# Patient Record
Sex: Female | Born: 1990 | Race: Asian | Hispanic: No | Marital: Married | Smoking: Never smoker
Health system: Southern US, Community
[De-identification: ages and names within clinical notes are randomized; demographics above are authoritative.]

## PROBLEM LIST (undated history)

## (undated) DIAGNOSIS — K602 Anal fissure, unspecified: Secondary | ICD-10-CM

## (undated) DIAGNOSIS — D649 Anemia, unspecified: Secondary | ICD-10-CM

## (undated) HISTORY — DX: Anemia, unspecified: D64.9

---

## 2019-02-21 HISTORY — PX: ANAL FISSURE REPAIR: SHX2312

## 2019-03-29 ENCOUNTER — Other Ambulatory Visit: Payer: Self-pay

## 2019-03-29 ENCOUNTER — Encounter (HOSPITAL_COMMUNITY): Payer: Self-pay

## 2019-03-29 ENCOUNTER — Emergency Department (HOSPITAL_COMMUNITY)
Admission: EM | Admit: 2019-03-29 | Discharge: 2019-03-30 | Disposition: A | Discharge status: Home / Self Care | Payer: No Typology Code available for payment source | Source: Home / Self Care | Attending: Emergency Medicine | Admitting: Emergency Medicine

## 2019-03-29 DIAGNOSIS — N12 Tubulo-interstitial nephritis, not specified as acute or chronic: Secondary | ICD-10-CM | POA: Insufficient documentation

## 2019-03-29 DIAGNOSIS — R300 Dysuria: Secondary | ICD-10-CM | POA: Diagnosis not present

## 2019-03-29 DIAGNOSIS — A4151 Sepsis due to Escherichia coli [E. coli]: Secondary | ICD-10-CM | POA: Diagnosis not present

## 2019-03-29 HISTORY — DX: Anal fissure, unspecified: K60.2

## 2019-03-29 LAB — URINALYSIS, ROUTINE W REFLEX MICROSCOPIC
Bilirubin Urine: NEGATIVE
Glucose, UA: NEGATIVE mg/dL
Ketones, ur: NEGATIVE mg/dL
Nitrite: NEGATIVE
Protein, ur: 30 mg/dL — AB
Specific Gravity, Urine: 1.005 (ref 1.005–1.030)
WBC, UA: >50 WBC/hpf — ABNORMAL HIGH (ref 0–5)
pH: 6 (ref 5.0–8.0)

## 2019-03-29 LAB — CBC
HCT: 29.2 % — ABNORMAL LOW (ref 36.0–46.0)
Hemoglobin: 8.4 g/dL — ABNORMAL LOW (ref 12.0–15.0)
MCH: 18.7 pg — ABNORMAL LOW (ref 26.0–34.0)
MCHC: 28.8 g/dL — ABNORMAL LOW (ref 30.0–36.0)
MCV: 65 fL — ABNORMAL LOW (ref 80.0–100.0)
Platelets: 312 10*3/uL (ref 150–400)
RBC: 4.49 MIL/uL (ref 3.87–5.11)
RDW: 19.6 % — ABNORMAL HIGH (ref 11.5–15.5)
WBC: 14.2 10*3/uL — ABNORMAL HIGH (ref 4.0–10.5)
nRBC: 0 % (ref 0.0–0.2)

## 2019-03-29 LAB — COMPREHENSIVE METABOLIC PANEL
ALT: 11 U/L (ref 0–44)
AST: 22 U/L (ref 15–41)
Albumin: 3.8 g/dL (ref 3.5–5.0)
Alkaline Phosphatase: 41 U/L (ref 38–126)
Anion gap: 11 (ref 5–15)
BUN: <5 mg/dL — ABNORMAL LOW (ref 6–20)
CO2: 22 mmol/L (ref 22–32)
Calcium: 9.1 mg/dL (ref 8.9–10.3)
Chloride: 97 mmol/L — ABNORMAL LOW (ref 98–111)
Creatinine, Ser: 0.8 mg/dL (ref 0.44–1.00)
GFR calc Af Amer: >60 mL/min (ref >60)
GFR calc non Af Amer: >60 mL/min (ref >60)
Glucose, Bld: 146 mg/dL — ABNORMAL HIGH (ref 70–99)
Potassium: 3.5 mmol/L (ref 3.5–5.1)
Sodium: 130 mmol/L — ABNORMAL LOW (ref 135–145)
Total Bilirubin: 0.3 mg/dL (ref 0.3–1.2)
Total Protein: 8.4 g/dL — ABNORMAL HIGH (ref 6.5–8.1)

## 2019-03-29 LAB — LIPASE, BLOOD: Lipase: 41 U/L (ref 11–51)

## 2019-03-29 MED ORDER — ONDANSETRON HCL 4 MG/2ML IJ SOLN
4.0000 mg | Freq: Once | INTRAMUSCULAR | Status: AC
  Administered 2019-03-30: 4 mg via INTRAVENOUS
  Filled 2019-03-29: qty 2

## 2019-03-29 MED ORDER — SODIUM CHLORIDE 0.9 % IV SOLN
1.0000 g | Freq: Once | INTRAVENOUS | Status: AC
  Administered 2019-03-30: 1 g via INTRAVENOUS
  Filled 2019-03-29: qty 10

## 2019-03-29 MED ORDER — SODIUM CHLORIDE 0.9 % IV BOLUS
2000.0000 mL | Freq: Once | INTRAVENOUS | Status: AC
  Administered 2019-03-30: 2000 mL via INTRAVENOUS

## 2019-03-29 MED ORDER — SODIUM CHLORIDE 0.9% FLUSH: 3.0000 mL | Freq: Once | INTRAVENOUS | Status: DC

## 2019-03-29 NOTE — ED Triage Notes (Signed)
Pt reports generalized body aches, fever and emesis after eating for the past few days, denies any abd pain, tachycardic in triage.

## 2019-03-30 ENCOUNTER — Telehealth: Payer: Self-pay | Admitting: Family

## 2019-03-30 ENCOUNTER — Ambulatory Visit: Payer: Self-pay | Admitting: Family Medicine

## 2019-03-30 DIAGNOSIS — E860 Dehydration: Secondary | ICD-10-CM | POA: Diagnosis present

## 2019-03-30 DIAGNOSIS — A4151 Sepsis due to Escherichia coli [E. coli]: Principal | ICD-10-CM | POA: Diagnosis present

## 2019-03-30 DIAGNOSIS — Z20822 Contact with and (suspected) exposure to covid-19: Secondary | ICD-10-CM

## 2019-03-30 DIAGNOSIS — N920 Excessive and frequent menstruation with regular cycle: Secondary | ICD-10-CM | POA: Diagnosis present

## 2019-03-30 DIAGNOSIS — Z0289 Encounter for other administrative examinations: Secondary | ICD-10-CM

## 2019-03-30 DIAGNOSIS — Z1612 Extended spectrum beta lactamase (ESBL) resistance: Secondary | ICD-10-CM | POA: Diagnosis present

## 2019-03-30 DIAGNOSIS — Z1624 Resistance to multiple antibiotics: Secondary | ICD-10-CM | POA: Diagnosis present

## 2019-03-30 DIAGNOSIS — N12 Tubulo-interstitial nephritis, not specified as acute or chronic: Secondary | ICD-10-CM | POA: Diagnosis present

## 2019-03-30 DIAGNOSIS — K5900 Constipation, unspecified: Secondary | ICD-10-CM | POA: Diagnosis present

## 2019-03-30 DIAGNOSIS — Z20828 Contact with and (suspected) exposure to other viral communicable diseases: Secondary | ICD-10-CM | POA: Diagnosis present

## 2019-03-30 DIAGNOSIS — E871 Hypo-osmolality and hyponatremia: Secondary | ICD-10-CM | POA: Diagnosis present

## 2019-03-30 DIAGNOSIS — E861 Hypovolemia: Secondary | ICD-10-CM | POA: Diagnosis present

## 2019-03-30 DIAGNOSIS — D509 Iron deficiency anemia, unspecified: Secondary | ICD-10-CM | POA: Diagnosis present

## 2019-03-30 LAB — PREGNANCY, URINE: Preg Test, Ur: NEGATIVE

## 2019-03-30 MED ORDER — CEPHALEXIN 500 MG PO CAPS: 500.0000 mg | ORAL_CAPSULE | Freq: Four times a day (QID) | ORAL | 0 refills | Status: DC

## 2019-03-30 MED ORDER — ONDANSETRON HCL 4 MG PO TABS: 4.0000 mg | ORAL_TABLET | Freq: Three times a day (TID) | ORAL | 0 refills | Status: DC | PRN

## 2019-03-30 MED ORDER — KETOROLAC TROMETHAMINE 30 MG/ML IJ SOLN
15.0000 mg | Freq: Once | INTRAMUSCULAR | Status: AC
  Administered 2019-03-30: 15 mg via INTRAVENOUS
  Filled 2019-03-30: qty 1

## 2019-03-30 MED ORDER — IBUPROFEN 800 MG PO TABS
800.0000 mg | ORAL_TABLET | Freq: Once | ORAL | Status: AC
  Administered 2019-03-30: 800 mg via ORAL
  Filled 2019-03-30: qty 1

## 2019-03-30 NOTE — ED Provider Notes (Signed)
[facility] Provider Note   CSN: [id number] Arrival date & time: 03/29/19  2229    History   Chief Complaint Chief Complaint  Patient presents with  . Generalized Body Aches  . Fever  . Emesis    HPI Diana Hensley is a 28 y.o. female.     28 year old female presents to the emergency department for evaluation of chills.  She has been experiencing chills over the past few days associated with body aches and fever up to approximately 101 F.  Anything that she has tried to eat or drink has often caused her to throw up.  Intermittently tolerating water as well as oranges.  Has noted some discomfort through her mid to low back.  Symptoms were preceded by urinary frequency and urgency with decreased urinary stream.  No frank dysuria.  Denies sick contacts, bowel changes, abdominal pain.  No history of prior abdominal surgeries.  LMP 03/12/2019.   Fever Associated symptoms: vomiting   Emesis Associated symptoms: fever     Past Medical History:  Diagnosis Date  . Anal fissure     There are no active problems to display for this patient.   History reviewed. No pertinent surgical history.   OB History   No obstetric history on file.      Home Medications    Prior to Admission medications   Medication Sig Start Date End Date Taking? Authorizing Provider  cephALEXin (KEFLEX) 500 MG capsule Take 1 capsule (500 mg total) by mouth 4 (four) times daily. 03/30/19   Antony MaduraHumes, Kenwood Rosiak, PA-C  ondansetron (ZOFRAN) 4 MG tablet Take 1 tablet (4 mg total) by mouth every 8 (eight) hours as needed for nausea or vomiting. 03/30/19   Antony MaduraHumes, Amerie Beaumont, PA-C    Family History No family history on file.  Social History Social History   Tobacco Use  . Smoking status: Not on file  Substance Use Topics  . Alcohol use: Not on file  . Drug use: Not on file     Allergies   Patient has no known allergies.   Review of Systems Review of Systems   Constitutional: Positive for fever.  Gastrointestinal: Positive for vomiting.  Ten systems reviewed and are negative for acute change, except as noted in the HPI.     Physical Exam Updated Vital Signs BP 107/67   Pulse 92   Temp 98.9 F (37.2 C) (Oral)   Resp 16   SpO2 99%   Physical Exam Vitals signs and nursing note reviewed.  Constitutional:      General: She is not in acute distress.    Appearance: She is well-developed.     Comments: Nontoxic appearing and in NAD  HENT:     Head: Normocephalic and atraumatic.  Eyes:     General: No scleral icterus.    Conjunctiva/sclera: Conjunctivae normal.  Neck:     Musculoskeletal: Normal range of motion.  Cardiovascular:     Rate and Rhythm: Regular rhythm. Tachycardia present.     Pulses: Normal pulses.  Pulmonary:     Effort: Pulmonary effort is normal. No respiratory distress.     Comments: Respirations even and unlabored Abdominal:     Palpations: There is no mass.     Tenderness: There is no guarding.     Comments: Bilateral CVA TTP, R>L. Soft nontender anterior abdomen. No distension or peritoneal signs.  Musculoskeletal: Normal range of motion.  Skin:    General: Skin is warm and dry.  Coloration: Skin is not pale.     Findings: No erythema or rash.  Neurological:     Mental Status: She is alert and oriented to person, place, and time.     Coordination: Coordination normal.  Psychiatric:        Behavior: Behavior normal.      ED Treatments / Results  Labs (all labs ordered are listed, but only abnormal results are displayed) Labs Reviewed  COMPREHENSIVE METABOLIC PANEL - Abnormal; Notable for the following components:      Result Value   Sodium 130 (*)    Chloride 97 (*)    Glucose, Bld 146 (*)    BUN <5 (*)    Total Protein 8.4 (*)    All other components within normal limits  CBC - Abnormal; Notable for the following components:   WBC 14.2 (*)    Hemoglobin 8.4 (*)    HCT 29.2 (*)    MCV 65.0  (*)    MCH 18.7 (*)    MCHC 28.8 (*)    RDW 19.6 (*)    All other components within normal limits  URINALYSIS, ROUTINE W REFLEX MICROSCOPIC - Abnormal; Notable for the following components:   APPearance CLOUDY (*)    Hgb urine dipstick SMALL (*)    Protein, ur 30 (*)    Leukocytes,Ua LARGE (*)    WBC, UA >50 (*)    Bacteria, UA MANY (*)    All other components within normal limits  URINE CULTURE  LIPASE, BLOOD  PREGNANCY, URINE    EKG None  Radiology No results found.  Procedures Procedures (including critical care time)  Medications Ordered in ED Medications  sodium chloride flush (NS) 0.9 % injection 3 mL (has no administration in time range)  sodium chloride 0.9 % bolus 2,000 mL (0 mLs Intravenous Stopped 03/30/19 0150)  cefTRIAXone (ROCEPHIN) 1 g in sodium chloride 0.9 % 100 mL IVPB (0 g Intravenous Stopped 03/30/19 0122)  ondansetron (ZOFRAN) injection 4 mg (4 mg Intravenous Given 03/30/19 0039)  ibuprofen (ADVIL) tablet 800 mg (800 mg Oral Given 03/30/19 0049)  ketorolac (TORADOL) 30 MG/ML injection 15 mg (15 mg Intravenous Given 03/30/19 0152)     Initial Impression / Assessment and Plan / ED Course  I have reviewed the triage vital signs and the nursing notes.  Pertinent labs & imaging results that were available during my care of the patient were reviewed by me and considered in my medical decision making (see chart for details).        Patient has been diagnosed with pyelonephritis. She is afebrile in the ED, but tachycardic in triage. This has improved with Tylenol and 2L IVF. Hx of nausea and vomiting over the past few days, but tolerating PO since receiving Zofran. She has evidence of UTI on UA. Leukocytosis and CVA TTP c/w clinical pyelonephritis. Kidney function preserved. Of note, the patient is anemic with hemoglobin of 8.4. This is microcytic suggesting chronic process. No old values for comparison.   Plan for discharge on Keflex 500mg  QID x 10 days.  Patient encouraged to f/u with a PCP for repeat evaluation to ensure symptom resolution. Return precautions discussed and provided. Patient discharged in stable condition with no unaddressed concerns.  Vitals:   03/30/19 0130 03/30/19 0215 03/30/19 0230 03/30/19 0300  BP: 112/74 106/72  107/67  Pulse: (!) 102 97 92   Resp:      Temp:      TempSrc:      SpO2:  100% 99% 99%     Final Clinical Impressions(s) / ED Diagnoses   Final diagnoses:  Pyelonephritis    ED Discharge Orders         Ordered    cephALEXin (KEFLEX) 500 MG capsule  4 times daily     03/30/19 0305    ondansetron (ZOFRAN) 4 MG tablet  Every 8 hours PRN     03/30/19 0306           Antony MaduraHumes, Liat Mayol, PA-C 03/30/19 0320    Nira Connardama, Pedro Eduardo, MD 03/30/19 [phone]

## 2019-03-30 NOTE — Progress Notes (Signed)
E-Visit for Corona Virus Screening   Your current symptoms could be consistent with the coronavirus.  Many health care providers can now test patients at their office but not all are.  [facility] has multiple testing sites. For information on our COVID testing locations and hours go to [email or web address]  Please quarantine yourself while awaiting your test results.  Approximately 5 minutes was spent documenting and reviewing patient's chart.    COVID-19 is a respiratory illness with symptoms that are similar to the flu. Symptoms are typically mild to moderate, but there have been cases of severe illness and death due to the virus. The following symptoms may appear 2-14 days after exposure: . Fever . Cough . Shortness of breath or difficulty breathing . Chills . Repeated shaking with chills . Muscle pain . Headache . Sore throat . New loss of taste or smell . Fatigue . Congestion or runny nose . Nausea or vomiting . Diarrhea  It is vitally important that if you feel that you have an infection such as this virus or any other virus that you stay home and away from places where you may spread it to others.  You should self-quarantine for 14 days if you have symptoms that could potentially be coronavirus or have been in close contact a with a person diagnosed with COVID-19 within the last 2 weeks. You should avoid contact with people age 65 and older.   You should wear a mask or cloth face covering over your nose and mouth if you must be around other people or animals, including pets (even at home). Try to stay at least 6 feet away from other people. This will protect the people around you.    You may also take acetaminophen (Tylenol) as needed for fever.   Reduce your risk of any infection by using the same precautions used for avoiding the common cold or flu:  . Wash your hands often with soap and warm water for at least 20 seconds.  If soap and water are  not readily available, use an alcohol-based hand sanitizer with at least 60% alcohol.  . If coughing or sneezing, cover your mouth and nose by coughing or sneezing into the elbow areas of your shirt or coat, into a tissue or into your sleeve (not your hands). . Avoid shaking hands with others and consider head nods or verbal greetings only. . Avoid touching your eyes, nose, or mouth with unwashed hands.  . Avoid close contact with people who are sick. . Avoid places or events with large numbers of people in one location, like concerts or sporting events. . Carefully consider travel plans you have or are making. . If you are planning any travel outside or inside the [place], visit the CDC's Travelers' Health webpage for the latest health notices. . If you have some symptoms but not all symptoms, continue to monitor at home and seek medical attention if your symptoms worsen. . If you are having a medical emergency, call 911.  HOME CARE . Only take medications as instructed by your medical team. . Drink plenty of fluids and get plenty of rest. . A steam or ultrasonic humidifier can help if you have congestion.   GET HELP RIGHT AWAY IF YOU HAVE EMERGENCY WARNING SIGNS** FOR COVID-19. If you or someone is showing any of these signs seek emergency medical care immediately. Call 911 or proceed to your closest emergency facility if: . You develop worsening high fever. . Trouble breathing . Bluish lips   or face . Persistent pain or pressure in the chest . New confusion . Inability to wake or stay awake . You cough up blood. . Your symptoms become more severe  **This list is not all possible symptoms. Contact your medical provider for any symptoms that are sever or concerning to you.   MAKE SURE YOU   Understand these instructions.  Will watch your condition.  Will get help right away if you are not doing well or get worse.  Your e-visit answers were reviewed by a board certified advanced  clinical practitioner to complete your personal care plan.  Depending on the condition, your plan could have included both over the counter or prescription medications.  If there is a problem please reply once you have received a response from your provider.  Your safety is important to us.  If you have drug allergies check your prescription carefully.    You can use MyChart to ask questions about today's visit, request a non-urgent call back, or ask for a work or school excuse for 24 hours related to this e-Visit. If it has been greater than 24 hours you will need to follow up with your provider, or enter a new e-Visit to address those concerns. You will get an e-mail in the next two days asking about your experience.  I hope that your e-visit has been valuable and will speed your recovery. Thank you for using e-visits.    

## 2019-03-30 NOTE — ED Notes (Signed)
Pt tolerating fluids well, no n/v reported

## 2019-03-30 NOTE — ED Notes (Signed)
Discharge instructions discussed with pt. Pt verbalized understanding. Pt stable and ambulatory. No signature pad.  °

## 2019-03-30 NOTE — ED Notes (Signed)
Patient husband would like an update via phone

## 2019-03-30 NOTE — Discharge Instructions (Signed)
Take Keflex as prescribed until finished for management of your bladder/kidney infection.  Do not miss any doses of this antibiotic.  Take 600 mg ibuprofen every 6 hours for body aches, fever, pain.  You may take this with Tylenol as needed for persistent symptoms.  Use Zofran as prescribed for nausea management.  Follow-up with a primary care doctor to ensure that symptoms resolve.

## 2019-03-31 ENCOUNTER — Inpatient Hospital Stay (HOSPITAL_COMMUNITY)
Admission: EM | Admit: 2019-03-31 | Discharge: 2019-04-02 | DRG: 872 | Disposition: A | Discharge status: Home / Self Care | Payer: No Typology Code available for payment source | Attending: Internal Medicine | Admitting: Internal Medicine

## 2019-03-31 ENCOUNTER — Encounter (HOSPITAL_COMMUNITY): Payer: Self-pay | Admitting: Emergency Medicine

## 2019-03-31 ENCOUNTER — Other Ambulatory Visit: Payer: Self-pay

## 2019-03-31 DIAGNOSIS — N390 Urinary tract infection, site not specified: Secondary | ICD-10-CM | POA: Diagnosis present

## 2019-03-31 DIAGNOSIS — A419 Sepsis, unspecified organism: Secondary | ICD-10-CM | POA: Diagnosis not present

## 2019-03-31 DIAGNOSIS — D509 Iron deficiency anemia, unspecified: Secondary | ICD-10-CM | POA: Diagnosis not present

## 2019-03-31 DIAGNOSIS — A4150 Gram-negative sepsis, unspecified: Secondary | ICD-10-CM | POA: Diagnosis present

## 2019-03-31 DIAGNOSIS — E871 Hypo-osmolality and hyponatremia: Secondary | ICD-10-CM | POA: Diagnosis not present

## 2019-03-31 DIAGNOSIS — N12 Tubulo-interstitial nephritis, not specified as acute or chronic: Secondary | ICD-10-CM

## 2019-03-31 LAB — URINALYSIS, ROUTINE W REFLEX MICROSCOPIC
Bilirubin Urine: NEGATIVE
Glucose, UA: NEGATIVE mg/dL
Hgb urine dipstick: NEGATIVE
Ketones, ur: 20 mg/dL — AB
Nitrite: NEGATIVE
Protein, ur: NEGATIVE mg/dL
Specific Gravity, Urine: 1.01 (ref 1.005–1.030)
pH: 7 (ref 5.0–8.0)

## 2019-03-31 LAB — CBC WITH DIFFERENTIAL/PLATELET
Abs Immature Granulocytes: 0.06 10*3/uL (ref 0.00–0.07)
Basophils Absolute: 0 10*3/uL (ref 0.0–0.1)
Basophils Relative: 0 %
Eosinophils Absolute: 0 10*3/uL (ref 0.0–0.5)
Eosinophils Relative: 0 %
HCT: 26.2 % — ABNORMAL LOW (ref 36.0–46.0)
Hemoglobin: 7.7 g/dL — ABNORMAL LOW (ref 12.0–15.0)
Immature Granulocytes: 1 %
Lymphocytes Relative: 11 %
Lymphs Abs: 1.4 10*3/uL (ref 0.7–4.0)
MCH: 19.4 pg — ABNORMAL LOW (ref 26.0–34.0)
MCHC: 29.4 g/dL — ABNORMAL LOW (ref 30.0–36.0)
MCV: 66 fL — ABNORMAL LOW (ref 80.0–100.0)
Monocytes Absolute: 2 10*3/uL — ABNORMAL HIGH (ref 0.1–1.0)
Monocytes Relative: 16 %
Neutro Abs: 9.2 10*3/uL — ABNORMAL HIGH (ref 1.7–7.7)
Neutrophils Relative %: 72 %
Platelets: 254 10*3/uL (ref 150–400)
RBC: 3.97 MIL/uL (ref 3.87–5.11)
RDW: 19.5 % — ABNORMAL HIGH (ref 11.5–15.5)
WBC: 12.6 10*3/uL — ABNORMAL HIGH (ref 4.0–10.5)
nRBC: 0 % (ref 0.0–0.2)

## 2019-03-31 LAB — COMPREHENSIVE METABOLIC PANEL
ALT: 11 U/L (ref 0–44)
AST: 17 U/L (ref 15–41)
Albumin: 3.3 g/dL — ABNORMAL LOW (ref 3.5–5.0)
Alkaline Phosphatase: 42 U/L (ref 38–126)
Anion gap: 9 (ref 5–15)
BUN: <5 mg/dL — ABNORMAL LOW (ref 6–20)
CO2: 21 mmol/L — ABNORMAL LOW (ref 22–32)
Calcium: 8.5 mg/dL — ABNORMAL LOW (ref 8.9–10.3)
Chloride: 101 mmol/L (ref 98–111)
Creatinine, Ser: 0.69 mg/dL (ref 0.44–1.00)
GFR calc Af Amer: >60 mL/min (ref >60)
GFR calc non Af Amer: >60 mL/min (ref >60)
Glucose, Bld: 114 mg/dL — ABNORMAL HIGH (ref 70–99)
Potassium: 4.2 mmol/L (ref 3.5–5.1)
Sodium: 131 mmol/L — ABNORMAL LOW (ref 135–145)
Total Bilirubin: 0.3 mg/dL (ref 0.3–1.2)
Total Protein: 7.1 g/dL (ref 6.5–8.1)

## 2019-03-31 LAB — URINE CULTURE: Culture: >=100000 — AB

## 2019-03-31 LAB — RETICULOCYTES
Immature Retic Fract: 15.2 % (ref 2.3–15.9)
RBC.: 3.69 MIL/uL — ABNORMAL LOW (ref 3.87–5.11)
Retic Count, Absolute: 30.6 10*3/uL (ref 19.0–186.0)
Retic Ct Pct: 0.8 % (ref 0.4–3.1)

## 2019-03-31 LAB — TYPE AND SCREEN
ABO/RH(D): O POS
Antibody Screen: NEGATIVE

## 2019-03-31 LAB — IRON AND TIBC
Iron: 6 ug/dL — ABNORMAL LOW (ref 28–170)
Saturation Ratios: 2 % — ABNORMAL LOW (ref 10.4–31.8)
TIBC: 300 ug/dL (ref 250–450)
UIBC: 294 ug/dL

## 2019-03-31 LAB — HIV ANTIBODY (ROUTINE TESTING W REFLEX): HIV Screen 4th Generation wRfx: NONREACTIVE

## 2019-03-31 LAB — LACTIC ACID, PLASMA: Lactic Acid, Venous: 1 mmol/L (ref 0.5–1.9)

## 2019-03-31 LAB — FOLATE: Folate: 22.4 ng/mL (ref >5.9)

## 2019-03-31 LAB — VITAMIN B12: Vitamin B-12: 157 pg/mL — ABNORMAL LOW (ref 180–914)

## 2019-03-31 LAB — ABO/RH: ABO/RH(D): O POS

## 2019-03-31 LAB — I-STAT BETA HCG BLOOD, ED (MC, WL, AP ONLY): I-stat hCG, quantitative: <5 m[IU]/mL (ref <5)

## 2019-03-31 LAB — FERRITIN: Ferritin: 17 ng/mL (ref 11–307)

## 2019-03-31 MED ORDER — SODIUM CHLORIDE 0.9 % IV SOLN
1.0000 g | Freq: Once | INTRAVENOUS | Status: AC
  Administered 2019-03-31: 03:00:00 1 g via INTRAVENOUS
  Filled 2019-03-31: qty 10

## 2019-03-31 MED ORDER — SODIUM CHLORIDE 0.9% FLUSH: 3.0000 mL | Freq: Two times a day (BID) | INTRAVENOUS | Status: DC

## 2019-03-31 MED ORDER — ONDANSETRON HCL 4 MG PO TABS: 4.0000 mg | ORAL_TABLET | Freq: Four times a day (QID) | ORAL | Status: DC | PRN

## 2019-03-31 MED ORDER — SODIUM CHLORIDE 0.9 % IV SOLN
1.0000 g | INTRAVENOUS | Status: DC
  Filled 2019-03-31: qty 10

## 2019-03-31 MED ORDER — SODIUM CHLORIDE 0.9 % IV SOLN
1.0000 g | Freq: Three times a day (TID) | INTRAVENOUS | Status: AC
  Administered 2019-03-31 – 2019-04-01 (×5): 1 g via INTRAVENOUS
  Filled 2019-03-31 (×6): qty 1

## 2019-03-31 MED ORDER — ONDANSETRON HCL 4 MG/2ML IJ SOLN
4.0000 mg | Freq: Once | INTRAMUSCULAR | Status: AC
  Administered 2019-03-31: 4 mg via INTRAVENOUS
  Filled 2019-03-31: qty 2

## 2019-03-31 MED ORDER — POLYETHYLENE GLYCOL 3350 17 G PO PACK: 17.0000 g | PACK | Freq: Every day | ORAL | Status: DC | PRN

## 2019-03-31 MED ORDER — ACETAMINOPHEN 650 MG RE SUPP: 650.0000 mg | Freq: Four times a day (QID) | RECTAL | Status: DC | PRN

## 2019-03-31 MED ORDER — SODIUM CHLORIDE 0.9 % IV BOLUS
1500.0000 mL | Freq: Once | INTRAVENOUS | Status: AC
  Administered 2019-03-31: 03:00:00 1500 mL via INTRAVENOUS

## 2019-03-31 MED ORDER — ONDANSETRON HCL 4 MG/2ML IJ SOLN: 4.0000 mg | Freq: Four times a day (QID) | INTRAMUSCULAR | Status: DC | PRN

## 2019-03-31 MED ORDER — SODIUM CHLORIDE 0.9 % IV SOLN
INTRAVENOUS | Status: DC
  Administered 2019-03-31 – 2019-04-01 (×3): via INTRAVENOUS

## 2019-03-31 MED ORDER — HYDROCODONE-ACETAMINOPHEN 5-325 MG PO TABS
1.0000 | ORAL_TABLET | ORAL | Status: DC | PRN
  Administered 2019-03-31 – 2019-04-01 (×3): 1 via ORAL
  Filled 2019-03-31 (×3): qty 1

## 2019-03-31 MED ORDER — ACETAMINOPHEN 325 MG PO TABS
650.0000 mg | ORAL_TABLET | Freq: Four times a day (QID) | ORAL | Status: DC | PRN
  Administered 2019-03-31: 650 mg via ORAL
  Filled 2019-03-31: qty 2

## 2019-03-31 MED ORDER — ACETAMINOPHEN 325 MG PO TABS
650.0000 mg | ORAL_TABLET | Freq: Once | ORAL | Status: AC
  Administered 2019-03-31: 650 mg via ORAL
  Filled 2019-03-31: qty 2

## 2019-03-31 NOTE — Progress Notes (Signed)
Patient's temperature 103.2. Tylenol give at 1350. Rechecked temp at 1422 temp was 102.7. Ghimire, MD text paged and made aware. Will continue to monitor this patient.

## 2019-03-31 NOTE — Progress Notes (Signed)
Patient received from ER to room 6N14 in stable condition. Pt alert and oriented x4. Oriented her to room,call bell use,. Discussed plan of care and instructed her to call for assistance if she needs to get up. She agreed she would and demonstrated call bell kuse.

## 2019-03-31 NOTE — ED Triage Notes (Signed)
Patient reports persistent emesis for 3 days , no diarrhea , febrile at triage . Denies sick contact with family .

## 2019-03-31 NOTE — ED Provider Notes (Signed)
[facility] EMERGENCY DEPARTMENT Provider Note   CSN: [id number] Arrival date & time: 03/30/19  2337    History   Chief Complaint Chief Complaint  Patient presents with  . Emesis    HPI Diana Hensley is a 28 y.o. female.    28 year old female presents to the emergency department for evaluation of persistent nausea and vomiting.  Was seen yesterday by myself and diagnosed with pyelonephritis.  Discharged on a course of Keflex.  Reports taking 3 doses of this antibiotic, though she has had difficulty keeping down medications as well as food and water due to persistent vomiting.  Her vomiting has been unrelieved with Zofran.  No complaints of abdominal pain or back pain.  Had a fever up to 101 F today.  Last took Tylenol this afternoon.  Noted to be anemic yesterday.  Denies increased bleeding or bruising, melena, hematochezia, hematuria, vaginal bleeding.  Was told that her hemoglobin was ~8.3 when she was living in [place], per patient.  The history is provided by the patient. No language interpreter was used.  Emesis   Past Medical History:  Diagnosis Date  . Anal fissure     Patient Active Problem List   Diagnosis Date Noted  . Sepsis secondary to UTI ([place]) 03/31/2019  . Microcytic anemia 03/31/2019  . Hyponatremia 03/31/2019    History reviewed. No pertinent surgical history.   OB History   No obstetric history on file.      Home Medications    Prior to Admission medications   Medication Sig Start Date End Date Taking? Authorizing Provider  cephALEXin (KEFLEX) 500 MG capsule Take 1 capsule (500 mg total) by mouth 4 (four) times daily. 03/30/19  Yes Antonietta Breach, PA-C  ondansetron (ZOFRAN) 4 MG tablet Take 1 tablet (4 mg total) by mouth every 8 (eight) hours as needed for nausea or vomiting. 03/30/19  Yes Antonietta Breach, PA-C    Family History History reviewed. No pertinent family history.  Social History Social History   Tobacco Use  .  Smoking status: Never Smoker  . Smokeless tobacco: Never Used  Substance Use Topics  . Alcohol use: Never    Frequency: Never  . Drug use: Never     Allergies   Patient has no known allergies.   Review of Systems Review of Systems  Gastrointestinal: Positive for vomiting.  Ten systems reviewed and are negative for acute change, except as noted in the HPI.    Physical Exam Updated Vital Signs BP 111/82 (BP Location: Left Arm)   Pulse 98   Temp 98.5 F (36.9 C) (Oral)   Resp 18   Ht 5\' 4"  (1.626 m)   Wt 53 kg   LMP 03/12/2019   SpO2 100%   BMI 20.06 kg/m   Physical Exam Vitals signs and nursing note reviewed.  Constitutional:      General: She is not in acute distress.    Appearance: She is well-developed. She is not diaphoretic.     Comments: Patient warm to touch. In NAD.  HENT:     Head: Normocephalic and atraumatic.  Eyes:     General: No scleral icterus.    Conjunctiva/sclera: Conjunctivae normal.  Neck:     Musculoskeletal: Normal range of motion.  Cardiovascular:     Rate and Rhythm: Normal rate and regular rhythm.     Pulses: Normal pulses.     Comments: Not tachycardic as noted in triage. Pulmonary:     Effort: Pulmonary effort is  normal. No respiratory distress.     Breath sounds: No stridor. No wheezing, rhonchi or rales.     Comments: Lungs CTAB. Respirations even and unlabored. Abdominal:     Palpations: There is no mass.     Tenderness: There is no abdominal tenderness. There is no guarding.     Comments: Soft, nondistended, nontender abdomen.  Musculoskeletal: Normal range of motion.  Skin:    General: Skin is warm and dry.     Coloration: Skin is not pale.     Findings: No erythema or rash.  Neurological:     Mental Status: She is alert and oriented to person, place, and time.  Psychiatric:        Behavior: Behavior normal.      ED Treatments / Results  Labs (all labs ordered are listed, but only abnormal results are displayed)  Labs Reviewed  CBC WITH DIFFERENTIAL/PLATELET - Abnormal; Notable for the following components:      Result Value   WBC 12.6 (*)    Hemoglobin 7.7 (*)    HCT 26.2 (*)    MCV 66.0 (*)    MCH 19.4 (*)    MCHC 29.4 (*)    RDW 19.5 (*)    Neutro Abs 9.2 (*)    Monocytes Absolute 2.0 (*)    All other components within normal limits  COMPREHENSIVE METABOLIC PANEL - Abnormal; Notable for the following components:   Sodium 131 (*)    CO2 21 (*)    Glucose, Bld 114 (*)    BUN <5 (*)    Calcium 8.5 (*)    Albumin 3.3 (*)    All other components within normal limits  URINALYSIS, ROUTINE W REFLEX MICROSCOPIC - Abnormal; Notable for the following components:   APPearance HAZY (*)    Ketones, ur 20 (*)    Leukocytes,Ua MODERATE (*)    Bacteria, UA MANY (*)    All other components within normal limits  NOVEL CORONAVIRUS, NAA (HOSPITAL ORDER, SEND-OUT TO REF LAB)  CULTURE, BLOOD (ROUTINE X 2)  CULTURE, BLOOD (ROUTINE X 2)  LACTIC ACID, PLASMA  I-STAT BETA HCG BLOOD, ED ([facility], [facility], [facility] ONLY)  TYPE AND SCREEN  ABO/RH    EKG None  Radiology No results found.  Procedures Procedures (including critical care time)  Medications Ordered in ED Medications  acetaminophen (TYLENOL) tablet 650 mg (650 mg Oral Given 03/31/19 0014)  sodium chloride 0.9 % bolus 1,500 mL (1,500 mLs Intravenous New Bag/Given 03/31/19 0232)  ondansetron (ZOFRAN) injection 4 mg (4 mg Intravenous Given 03/31/19 0228)  cefTRIAXone (ROCEPHIN) 1 g in sodium chloride 0.9 % 100 mL IVPB (0 g Intravenous Stopped 03/31/19 0303)     Initial Impression / Assessment and Plan / ED Course  I have reviewed the triage vital signs and the nursing notes.  Pertinent labs & imaging results that were available during my care of the patient were reviewed by me and considered in my medical decision making (see chart for details).        28 year old female presents to the emergency department for persistent fever, generalized weakness,  intractable nausea and vomiting.  Was discharged yesterday from the ED with Zofran, but has continued to be unable to tolerate fluids by mouth.  Febrile and tachycardic in triage.  This has improved with antipyretics.  Diagnosed with pyelonephritis yesterday.  Urine today appears improved.  Plan for admission for continued observation, IV antibiotics.  Dr. Antionette Charpyd to admit.   Final Clinical Impressions(s) /  ED Diagnoses   Final diagnoses:  Pyelonephritis    ED Discharge Orders    None       Antony MaduraHumes, Darriel Utter, PA-C 03/31/19 [id number]    Zadie RhineWickline, Donald, MD 03/31/19 0600

## 2019-03-31 NOTE — H&P (Signed)
History and Physical    Diana Hensley ZOX:[id number] DOB: [birth year] DOA: 03/31/2019  PCP: Patient, No Pcp Per   Patient coming from: Home   Chief Complaint: Dysuria, N/V, fevers  HPI: Diana GumsMariya Hensley is a 28 y.o. female with medical history significant for anemia, now presenting to the emergency department for evaluation of fevers, dysuria, nausea, and vomiting.  Patient was seen in the emergency department the night of 03/29/2019 for the same symptoms that developed a few days earlier.  She was diagnosed with urinary tract infection, given a shot of Rocephin, 2 L of IV fluids, and was discharged home with Keflex.  She continues to have fevers, chills, nausea, and nonbloody vomiting, feels as though she is not improving at all.  She denies any flank pain or abdominal pain, but she reports some general aches and malaise.  She denies any cough or shortness of breath, and she does not know of any sick contacts.  She reports being told that she was anemic previously, believes that her hemoglobin was 8 at that time, denies melena or hematochezia, denies easy bruising or bleeding, and reports heavy menstrual bleeding every month.  ED Course: Upon arrival to the ED, patient is found to be febrile to 39.4 C, saturating well on room air, tachycardic in 110s, and with stable blood pressure.  Chemistry panel is notable for a sodium of 131 and CBC features a leukocytosis to 12,600 and microcytic anemia with hemoglobin 7.7.  Blood cultures were collected, 1.5 L normal saline was administered, and the patient was treated with acetaminophen and Rocephin.  She has defervesced and heart rate has normalized, but she continues to have nausea, unsure if she will be able to take oral antibiotic, and hospitalist consulted for admission.  Review of Systems:  All other systems reviewed and apart from HPI, are negative.  Past Medical History:  Diagnosis Date  . Anal fissure     History reviewed. No pertinent surgical  history.   reports that she has never smoked. She has never used smokeless tobacco. She reports that she does not drink alcohol or use drugs.  No Known Allergies  History reviewed. No pertinent family history.   Prior to Admission medications   Medication Sig Start Date End Date Taking? Authorizing Provider  cephALEXin (KEFLEX) 500 MG capsule Take 1 capsule (500 mg total) by mouth 4 (four) times daily. 03/30/19  Yes Antony MaduraHumes, Kelly, PA-C  ondansetron (ZOFRAN) 4 MG tablet Take 1 tablet (4 mg total) by mouth every 8 (eight) hours as needed for nausea or vomiting. 03/30/19  Yes Antony MaduraHumes, Kelly, PA-C    Physical Exam: Vitals:   03/30/19 2345 03/31/19 0003 03/31/19 0230 03/31/19 0253  BP: 115/72 105/71 117/77 111/82  Pulse: (!) 115 (!) 113 85 98  Resp: 17 16  18   Temp: (!) 102.9 F (39.4 C) (!) 102.4 F (39.1 C)  98.5 F (36.9 C)  TempSrc: Oral Oral  Oral  SpO2: 100% 100% 100% 100%  Weight: 53 kg     Height: 5\' 4"  (1.626 m)       Constitutional: NAD, calm  Eyes: PERTLA, lids and conjunctivae normal ENMT: Mucous membranes are moist. Posterior pharynx clear of any exudate or lesions.   Neck: normal, supple, no masses, no thyromegaly Respiratory:  no wheezing, no crackles. Normal respiratory effort. No accessory muscle use.  Cardiovascular: S1 & S2 heard, regular rate and rhythm. No extremity edema.  Abdomen: No distension, no tenderness, soft. Bowel sounds active.  Musculoskeletal: no clubbing /  cyanosis. No joint deformity upper and lower extremities.    Skin: no significant rashes, lesions, ulcers. Warm, dry, well-perfused. Neurologic: CN 2-12 grossly intact. Sensation intact. Strength 5/5 in all 4 limbs.  Psychiatric: Alert and oriented x 3. Pleasant, cooperative.    Labs on Admission: I have personally reviewed following labs and imaging studies  CBC: Recent Labs  Lab 03/29/19 2249 03/31/19 0015  WBC 14.2* 12.6*  NEUTROABS  --  9.2*  HGB 8.4* 7.7*  HCT 29.2* 26.2*  MCV  65.0* 66.0*  PLT 312 277   Basic Metabolic Panel: Recent Labs  Lab 03/29/19 2249 03/31/19 0015  NA 130* 131*  K 3.5 4.2  CL 97* 101  CO2 22 21*  GLUCOSE 146* 114*  BUN <5* <5*  CREATININE 0.80 0.69  CALCIUM 9.1 8.5*   GFR: Estimated Creatinine Clearance: 89.2 mL/min (by C-G formula based on SCr of 0.69 mg/dL). Liver Function Tests: Recent Labs  Lab 03/29/19 2249 03/31/19 0015  AST 22 17  ALT 11 11  ALKPHOS 41 42  BILITOT 0.3 0.3  PROT 8.4* 7.1  ALBUMIN 3.8 3.3*   Recent Labs  Lab 03/29/19 2249  LIPASE 41   No results for input(s): AMMONIA in the last 168 hours. Coagulation Profile: No results for input(s): INR, PROTIME in the last 168 hours. Cardiac Enzymes: No results for input(s): CKTOTAL, CKMB, CKMBINDEX, TROPONINI in the last 168 hours. BNP (last 3 results) No results for input(s): PROBNP in the last 8760 hours. HbA1C: No results for input(s): HGBA1C in the last 72 hours. CBG: No results for input(s): GLUCAP in the last 168 hours. Lipid Profile: No results for input(s): CHOL, HDL, LDLCALC, TRIG, CHOLHDL, LDLDIRECT in the last 72 hours. Thyroid Function Tests: No results for input(s): TSH, T4TOTAL, FREET4, T3FREE, THYROIDAB in the last 72 hours. Anemia Panel: No results for input(s): VITAMINB12, FOLATE, FERRITIN, TIBC, IRON, RETICCTPCT in the last 72 hours. Urine analysis:    Component Value Date/Time   COLORURINE YELLOW 03/31/2019 0013   APPEARANCEUR HAZY (A) 03/31/2019 0013   LABSPEC 1.010 03/31/2019 0013   PHURINE 7.0 03/31/2019 0013   GLUCOSEU NEGATIVE 03/31/2019 0013   HGBUR NEGATIVE 03/31/2019 0013   BILIRUBINUR NEGATIVE 03/31/2019 0013   KETONESUR 20 (A) 03/31/2019 0013   PROTEINUR NEGATIVE 03/31/2019 0013   NITRITE NEGATIVE 03/31/2019 0013   LEUKOCYTESUR MODERATE (A) 03/31/2019 0013   Sepsis Labs: @LABRCNTIP (procalcitonin:4,lacticidven:4) ) Recent Results (from the past 240 hour(s))  Urine culture     Status: Abnormal (Preliminary  result)   Collection Time: 03/30/19 12:30 AM   Specimen: Urine, Clean Catch  Result Value Ref Range Status   Specimen Description URINE, CLEAN CATCH  Final   Special Requests NONE  Final   Culture (A)  Final    >=100,000 COLONIES/mL GRAM NEGATIVE RODS IDENTIFICATION AND SUSCEPTIBILITIES TO FOLLOW Performed at [facility], [address] [place], [place] [address]    Report Status PENDING  Incomplete     Radiological Exams on Admission: No results found.  EKG: Not performed.    Assessment/Plan   1. UTI  - Presents with ongoing dysuria, fevers, and N/V after she was diagnosed with UTI the night of 7/14 and started on Keflex  - She is found to be febrile to 39.4 C with tachycardia and leukocytosis, stable BP and reassuringly normal lactate  - Urine culture from 7/15 is growing >100k cfu/mL GNR  - Blood cultures collected in ED, 1.5 L NS given, and Rocephin started  -  Continue Rocephin pending cultures and clinical course   2. Hyponatremia  - Serum sodium is 131 in setting of hypovolemia  - Given 1.5 L NS in ED and continued on IVF hydration, will repeat chem panel in am    3. Microcytic anemia  - Hgb is 7.7 in ED, down from 8.4 the day prior with drop likely dilutional as she received 2 liter IVF between these  - MCV is 66  - She reports hx of anemia, reports heavy menstruation, denies melena or hematochezia or easy bruising/bleeding  - Check anemia panel     PPE: Mask, face shield. Patient wearing mask.  DVT prophylaxis: SCD's  Code Status: Full  Family Communication: Discussed with patient  Consults called: None Admission status: Observation     Briscoe Deutscherimothy S , MD [facility] Pager [phone]  If 7PM-7AM, please contact night-coverage [email or web address] Password [email or web address]  03/31/2019, 3:23 AM

## 2019-03-31 NOTE — Progress Notes (Signed)
Patient seen and examined.  Admitted early morning hours by nighttime hospitalist with failed outpatient therapy and unable to tolerate oral antibiotics that was prescribed from ER the day before.  Patient has high fever with nausea vomiting.  Treated with IV fluids, was started on Rocephin.  Urine culture from previous ER visit shows ESBL only sensitive to carboplatinum's and nitrofurantoin.  Renal functions are normal. Plan: Discontinue Rocephin.  Will start patient on meropenem.  Once patient has good clinical improvement, will need outpatient regimen either IV or oral.

## 2019-03-31 NOTE — ED Notes (Signed)
ED TO INPATIENT HANDOFF REPORT  ED Nurse Name and Phone #: [phone]  S Name/Age/Gender Diana Hensley 28 y.o. female Room/Bed: 030C/030C  Code Status   Code Status: Not on file  Home/SNF/Other Home Patient oriented to: self, place, time and situation Is this baseline? Yes   Triage Complete: Triage complete  Chief Complaint nausea vomiting. (pt left 3am today)  Triage Note Patient reports persistent emesis for 3 days , no diarrhea , febrile at triage . Denies sick contact with family .    Allergies No Known Allergies  Level of Care/Admitting Diagnosis ED Disposition    ED Disposition Condition Comment   Admit  [facility] [[id number]]  Level of Care: Med-Surg [16]  I expect the patient will be discharged within 24 hours: Yes  LOW acuity---Tx typically complete <24 hrs---ACUTE conditions typically can be evaluated <24 hours---LABS likely to return to acceptable levels <24 hours---IS near functional baseline---EXPECTED to return to current living arrangement---NOT newly hypoxic: Meets criteria for 5C-Observation unit  Covid Evaluation: Asymptomatic Screening Protocol (No Symptoms)  Diagnosis: Sepsis secondary to UTI [facility](HCC) [[id number]]) [[id number]]  Admitting Physician: Briscoe DeutscherPYD, TIMOTHY S [[id number]]  Attending Physician: Briscoe DeutscherPYD, TIMOTHY S [[id number]]  PT Class (Do Not Modify): Observation [104]  PT Acc Code (Do Not Modify): Observation [10022]       B Medical/Surgery History Past Medical History:  Diagnosis Date  . Anal fissure    History reviewed. No pertinent surgical history.   A IV Location/Drains/Wounds Patient Lines/Drains/Airways Status   Active Line/Drains/Airways    Name:   Placement date:   Placement time:   Site:   Days:   Peripheral IV 03/31/19 Right Antecubital   03/31/19    0221    Antecubital   less than 1          Intake/Output Last 24 hours  Intake/Output Summary (Last 24 hours) at 03/31/2019 [id number] Last data filed at 03/31/2019  0303 Gross per 24 hour  Intake 100 ml  Output -  Net 100 ml    Labs/Imaging Results for orders placed or performed during the hospital encounter of 03/31/19 (from the past 48 hour(s))  Urinalysis, Routine w reflex microscopic     Status: Abnormal   Collection Time: 03/31/19 12:13 AM  Result Value Ref Range   Color, Urine YELLOW YELLOW   APPearance HAZY (A) CLEAR   Specific Gravity, Urine 1.010 1.005 - 1.030   pH 7.0 5.0 - 8.0   Glucose, UA NEGATIVE NEGATIVE mg/dL   Hgb urine dipstick NEGATIVE NEGATIVE   Bilirubin Urine NEGATIVE NEGATIVE   Ketones, ur 20 (A) NEGATIVE mg/dL   Protein, ur NEGATIVE NEGATIVE mg/dL   Nitrite NEGATIVE NEGATIVE   Leukocytes,Ua MODERATE (A) NEGATIVE   RBC / HPF 0-5 0 - 5 RBC/hpf   WBC, UA 6-10 0 - 5 WBC/hpf   Bacteria, UA MANY (A) NONE SEEN   Squamous Epithelial / LPF 0-5 0 - 5   Mucus PRESENT     Comment: Performed at [facility], [address] [place], [place] [address]  CBC with Differential     Status: Abnormal   Collection Time: 03/31/19 12:15 AM  Result Value Ref Range   WBC 12.6 (H) 4.0 - 10.5 K/uL   RBC 3.97 3.87 - 5.11 MIL/uL   Hemoglobin 7.7 (L) 12.0 - 15.0 g/dL    Comment: Reticulocyte Hemoglobin testing may be clinically indicated, consider ordering this additional test [id number]    HCT 26.2 (L) 36.0 - 46.0 %  MCV 66.0 (L) 80.0 - 100.0 fL   MCH 19.4 (L) 26.0 - 34.0 pg   MCHC 29.4 (L) 30.0 - 36.0 g/dL   RDW 16.119.5 (H) 09.611.5 - 04.515.5 %   Platelets 254 150 - 400 K/uL   nRBC 0.0 0.0 - 0.2 %   Neutrophils Relative % 72 %   Neutro Abs 9.2 (H) 1.7 - 7.7 K/uL   Lymphocytes Relative 11 %   Lymphs Abs 1.4 0.7 - 4.0 K/uL   Monocytes Relative 16 %   Monocytes Absolute 2.0 (H) 0.1 - 1.0 K/uL   Eosinophils Relative 0 %   Eosinophils Absolute 0.0 0.0 - 0.5 K/uL   Basophils Relative 0 %   Basophils Absolute 0.0 0.0 - 0.1 K/uL   Immature Granulocytes 1 %   Abs Immature Granulocytes 0.06 0.00 - 0.07 K/uL   Ovalocytes PRESENT      Comment: Performed at [facility], [address] [place], [place] [address]  Comprehensive metabolic panel     Status: Abnormal   Collection Time: 03/31/19 12:15 AM  Result Value Ref Range   Sodium 131 (L) 135 - 145 mmol/L   Potassium 4.2 3.5 - 5.1 mmol/L   Chloride 101 98 - 111 mmol/L   CO2 21 (L) 22 - 32 mmol/L   Glucose, Bld 114 (H) 70 - 99 mg/dL   BUN <5 (L) 6 - 20 mg/dL   Creatinine, Ser 1.910.69 0.44 - 1.00 mg/dL   Calcium 8.5 (L) 8.9 - 10.3 mg/dL   Total Protein 7.1 6.5 - 8.1 g/dL   Albumin 3.3 (L) 3.5 - 5.0 g/dL   AST 17 15 - 41 U/L   ALT 11 0 - 44 U/L   Alkaline Phosphatase 42 38 - 126 U/L   Total Bilirubin 0.3 0.3 - 1.2 mg/dL   GFR calc non Af Amer >60 >60 mL/min   GFR calc Af Amer >60 >60 mL/min   Anion gap 9 5 - 15    Comment: Performed at [facility], [address] [place], [place] [address]  I-Stat Beta hCG blood, ED ([facility], [facility], [facility] only)     Status: None   Collection Time: 03/31/19 12:27 AM  Result Value Ref Range   I-stat hCG, quantitative <5.0 <5 mIU/mL   Comment 3            Comment:   GEST. AGE      CONC.  (mIU/mL)   <=1 WEEK        5 - 50     2 WEEKS       50 - 500     3 WEEKS       100 - 10,000     4 WEEKS     1,000 - 30,000        FEMALE AND NON-PREGNANT FEMALE:     LESS THAN 5 mIU/mL   Lactic acid, plasma     Status: None   Collection Time: 03/31/19  2:16 AM  Result Value Ref Range   Lactic Acid, Venous 1.0 0.5 - 1.9 mmol/L    Comment: Performed at [facility], [address] [place], [place] [address]   No results found.  Pending Labs Unresulted Labs (From admission, onward)    Start     Ordered   03/31/19 0216  ABO/Rh  Once,   R     03/31/19 0216   03/31/19 0158  Blood Culture (routine x 2)  BLOOD CULTURE X  2,   STAT     03/31/19 0157   03/31/19 0130  Novel Coronavirus,NAA,(SEND-OUT TO REF LAB - TAT 24-48 hrs); Hosp Order  (Asymptomatic Patients Labs)  Once,   STAT    Question:  Rule Out  Answer:  Yes   03/31/19 0129    03/31/19 0129  Type and screen  Once,   STAT     03/31/19 0128   Signed and Held  HIV antibody (Routine Testing)  Tomorrow morning,   R     Signed and Held   Signed and Held  Basic metabolic panel  Tomorrow morning,   R     Signed and Held   Signed and Held  CBC WITH DIFFERENTIAL  Tomorrow morning,   R     Signed and Held   Signed and Held  Vitamin B12  (Anemia Panel (PNL))  Tomorrow morning,   R     Signed and Held   Signed and Held  Folate  (Anemia Panel (PNL))  Tomorrow morning,   R     Signed and Held   Signed and Held  Iron and TIBC  (Anemia Panel (PNL))  Tomorrow morning,   R     Signed and Held   Signed and Held  Ferritin  (Anemia Panel (PNL))  Tomorrow morning,   R     Signed and Held   Signed and Held  Reticulocytes  (Anemia Panel (PNL))  Tomorrow morning,   R     Signed and Held          Vitals/Pain Today's Vitals   03/31/19 0003 03/31/19 0004 03/31/19 0230 03/31/19 0253  BP: 105/71  117/77 111/82  Pulse: (!) 113  85 98  Resp: 16   18  Temp: (!) 102.4 F (39.1 C)   98.5 F (36.9 C)  TempSrc: Oral   Oral  SpO2: 100%  100% 100%  Weight:      Height:      PainSc:  0-No pain      Isolation Precautions No active isolations  Medications Medications  acetaminophen (TYLENOL) tablet 650 mg (650 mg Oral Given 03/31/19 0014)  sodium chloride 0.9 % bolus 1,500 mL (1,500 mLs Intravenous New Bag/Given 03/31/19 0232)  ondansetron (ZOFRAN) injection 4 mg (4 mg Intravenous Given 03/31/19 0228)  cefTRIAXone (ROCEPHIN) 1 g in sodium chloride 0.9 % 100 mL IVPB (0 g Intravenous Stopped 03/31/19 0303)    Mobility walks Low fall risk   Focused Assessments   R Recommendations: See Admitting Provider Note  Report given to:   Additional Notes:

## 2019-04-01 DIAGNOSIS — Z20828 Contact with and (suspected) exposure to other viral communicable diseases: Secondary | ICD-10-CM | POA: Diagnosis present

## 2019-04-01 DIAGNOSIS — Z1624 Resistance to multiple antibiotics: Secondary | ICD-10-CM | POA: Diagnosis present

## 2019-04-01 DIAGNOSIS — R300 Dysuria: Secondary | ICD-10-CM | POA: Diagnosis present

## 2019-04-01 DIAGNOSIS — N920 Excessive and frequent menstruation with regular cycle: Secondary | ICD-10-CM | POA: Diagnosis present

## 2019-04-01 DIAGNOSIS — D509 Iron deficiency anemia, unspecified: Secondary | ICD-10-CM | POA: Diagnosis present

## 2019-04-01 DIAGNOSIS — A4151 Sepsis due to Escherichia coli [E. coli]: Secondary | ICD-10-CM | POA: Diagnosis present

## 2019-04-01 DIAGNOSIS — A4150 Gram-negative sepsis, unspecified: Secondary | ICD-10-CM | POA: Diagnosis present

## 2019-04-01 DIAGNOSIS — E860 Dehydration: Secondary | ICD-10-CM | POA: Diagnosis present

## 2019-04-01 DIAGNOSIS — E871 Hypo-osmolality and hyponatremia: Secondary | ICD-10-CM | POA: Diagnosis present

## 2019-04-01 DIAGNOSIS — N12 Tubulo-interstitial nephritis, not specified as acute or chronic: Secondary | ICD-10-CM | POA: Diagnosis present

## 2019-04-01 DIAGNOSIS — Z1612 Extended spectrum beta lactamase (ESBL) resistance: Secondary | ICD-10-CM | POA: Diagnosis present

## 2019-04-01 DIAGNOSIS — A419 Sepsis, unspecified organism: Secondary | ICD-10-CM | POA: Diagnosis not present

## 2019-04-01 DIAGNOSIS — E861 Hypovolemia: Secondary | ICD-10-CM | POA: Diagnosis present

## 2019-04-01 DIAGNOSIS — N390 Urinary tract infection, site not specified: Secondary | ICD-10-CM | POA: Diagnosis not present

## 2019-04-01 DIAGNOSIS — K5900 Constipation, unspecified: Secondary | ICD-10-CM | POA: Diagnosis present

## 2019-04-01 LAB — CBC WITH DIFFERENTIAL/PLATELET
Abs Immature Granulocytes: 0.04 10*3/uL (ref 0.00–0.07)
Basophils Absolute: 0 10*3/uL (ref 0.0–0.1)
Basophils Relative: 0 %
Eosinophils Absolute: 0 10*3/uL (ref 0.0–0.5)
Eosinophils Relative: 0 %
HCT: 24.6 % — ABNORMAL LOW (ref 36.0–46.0)
Hemoglobin: 7.1 g/dL — ABNORMAL LOW (ref 12.0–15.0)
Immature Granulocytes: 0 %
Lymphocytes Relative: 26 %
Lymphs Abs: 2.8 10*3/uL (ref 0.7–4.0)
MCH: 18.9 pg — ABNORMAL LOW (ref 26.0–34.0)
MCHC: 28.9 g/dL — ABNORMAL LOW (ref 30.0–36.0)
MCV: 65.4 fL — ABNORMAL LOW (ref 80.0–100.0)
Monocytes Absolute: 1.1 10*3/uL — ABNORMAL HIGH (ref 0.1–1.0)
Monocytes Relative: 10 %
Neutro Abs: 6.8 10*3/uL (ref 1.7–7.7)
Neutrophils Relative %: 64 %
Platelets: 224 10*3/uL (ref 150–400)
RBC: 3.76 MIL/uL — ABNORMAL LOW (ref 3.87–5.11)
RDW: 19.7 % — ABNORMAL HIGH (ref 11.5–15.5)
WBC: 10.8 10*3/uL — ABNORMAL HIGH (ref 4.0–10.5)
nRBC: 0 % (ref 0.0–0.2)

## 2019-04-01 LAB — BASIC METABOLIC PANEL
Anion gap: 10 (ref 5–15)
BUN: <5 mg/dL — ABNORMAL LOW (ref 6–20)
CO2: 19 mmol/L — ABNORMAL LOW (ref 22–32)
Calcium: 8.3 mg/dL — ABNORMAL LOW (ref 8.9–10.3)
Chloride: 105 mmol/L (ref 98–111)
Creatinine, Ser: 0.63 mg/dL (ref 0.44–1.00)
GFR calc Af Amer: >60 mL/min (ref >60)
GFR calc non Af Amer: >60 mL/min (ref >60)
Glucose, Bld: 104 mg/dL — ABNORMAL HIGH (ref 70–99)
Potassium: 4.5 mmol/L (ref 3.5–5.1)
Sodium: 134 mmol/L — ABNORMAL LOW (ref 135–145)

## 2019-04-01 LAB — NOVEL CORONAVIRUS, NAA (HOSP ORDER, SEND-OUT TO REF LAB; TAT 18-24 HRS): SARS-CoV-2, NAA: NOT DETECTED

## 2019-04-01 MED ORDER — SENNOSIDES-DOCUSATE SODIUM 8.6-50 MG PO TABS
1.0000 | ORAL_TABLET | Freq: Two times a day (BID) | ORAL | Status: DC
  Administered 2019-04-01 – 2019-04-02 (×3): 1 via ORAL
  Filled 2019-04-01 (×3): qty 1

## 2019-04-01 MED ORDER — FERROUS SULFATE 325 (65 FE) MG PO TABS
325.0000 mg | ORAL_TABLET | Freq: Three times a day (TID) | ORAL | Status: DC
  Administered 2019-04-01 – 2019-04-02 (×4): 325 mg via ORAL
  Filled 2019-04-01 (×4): qty 1

## 2019-04-01 MED ORDER — POLYETHYLENE GLYCOL 3350 17 G PO PACK
17.0000 g | PACK | Freq: Every day | ORAL | Status: DC
  Administered 2019-04-01 – 2019-04-02 (×2): 17 g via ORAL
  Filled 2019-04-01 (×2): qty 1

## 2019-04-01 MED ORDER — VITAMIN B-12 1000 MCG PO TABS
1000.0000 ug | ORAL_TABLET | Freq: Every day | ORAL | Status: DC
  Administered 2019-04-01 – 2019-04-02 (×2): 1000 ug via ORAL
  Filled 2019-04-01 (×2): qty 1

## 2019-04-01 MED ORDER — SODIUM CHLORIDE 0.9 % IV SOLN
1.0000 g | INTRAVENOUS | Status: DC
  Administered 2019-04-02: 1000 mg via INTRAVENOUS
  Filled 2019-04-01 (×2): qty 1

## 2019-04-01 MED ORDER — SODIUM CHLORIDE 0.9 % IV SOLN
25.0000 mg | Freq: Once | INTRAVENOUS | Status: AC
  Administered 2019-04-01: 10:00:00 25 mg via INTRAVENOUS
  Filled 2019-04-01: qty 0.5

## 2019-04-01 MED ORDER — SODIUM CHLORIDE 0.9 % IV SOLN
250.0000 mg | Freq: Once | INTRAVENOUS | Status: AC
  Administered 2019-04-01: 12:00:00 250 mg via INTRAVENOUS
  Filled 2019-04-01: qty 5

## 2019-04-01 MED ORDER — CYANOCOBALAMIN 1000 MCG/ML IJ SOLN
1000.0000 ug | Freq: Once | INTRAMUSCULAR | Status: AC
  Administered 2019-04-01: 10:00:00 1000 ug via INTRAMUSCULAR
  Filled 2019-04-01: qty 1

## 2019-04-01 NOTE — Progress Notes (Signed)
PROGRESS NOTE    Diana Hensley  ZOX:[id number] DOB: 09-02-1991 DOA: 03/31/2019 PCP: Patient, No Pcp Per    Brief Narrative:  28 year old female with history of chronic microcytic anemia presented to the emergency room with fever, dysuria nausea vomiting.  She had dysuria was seen in the emergency room the night before and was given 1 dose of Rocephin, IV fluid and discharged home on Keflex.  Patient continued to have symptoms and could not take any oral medications, temperature was 103 and she presented back to the emergency room.  She was admitted to treat for her UTI with intolerance to oral antibiotics.   Assessment & Plan:   Principal Problem:   Sepsis secondary to UTI [facility](HCC) Active Problems:   Microcytic anemia   Hyponatremia   Sepsis due to gram-negative UTI (HCC)  Acute UTI present on admission, sepsis secondary to UTI: Gradually improving.  Urine culture with ESBL E. coli only sensitive to carboplatinum's and nitrofurantoin.  Previously treated with Rocephin. Continue meropenem.  Blood cultures negative so far.  If good clinical improvement, will discuss with infectious disease tomorrow about discharge regimen.  May discharge on nitrofurantoin.  Microcytic anemia: Iron level 6, ferritin 17, folate 22.4, B12 157.  She has component of both iron deficiency anemia and B12 recency.  Hemoglobin is 7.1.  Currently no indication for transfusion.  Patient had recently suffered from anal fissure and had used multiple antibiotics.  Baseline hemoglobin was 8.3 about 6 months ago. No indication for blood transfusion at this time. B12 injection thousand micrograms today. Iron dextran 250 mg IV today.  Repeat tomorrow morning. Discharge on oral iron and B12 therapy.  Hyponatremia: Due to dehydration.  Treated with isotonic saline with improvement.  Patient has severe systemic disease.  She has ESBL E. coli and sepsis due to UTI.  She will need hospital admission and treatment.    DVT  prophylaxis: SCDs Code Status: Full code Family Communication: Patient's husband on the speaker phone Disposition Plan: Inpatient.  Home after hospitalization.   Consultants:   None  Procedures:   None  Antimicrobials:   Rocephin, 03/30/2019-03/31/2019  Meropenem 1 g IV 3 times daily, 03/31/2019---   Subjective: Patient was seen and examined.  Her husband was over the phone.  Her fever has been trending down.  Complains of constipation.  Today no nausea.  T-max 100.7.  Objective: Vitals:   03/31/19 1430 03/31/19 1837 03/31/19 1954 04/01/19 0457  BP:   100/69 127/80  Pulse:   88 95  Resp:   18 18  Temp: (!) 100.7 F (38.2 C) 98.8 F (37.1 C) 99.5 F (37.5 C) 99.3 F (37.4 C)  TempSrc: Oral Oral Oral Oral  SpO2:   100% 100%  Weight:      Height:        Intake/Output Summary (Last 24 hours) at 04/01/2019 1110 Last data filed at 04/01/2019 0847 Gross per 24 hour  Intake 1183.22 ml  Output -  Net 1183.22 ml   Filed Weights   03/30/19 2345  Weight: 53 kg    Examination:  General exam: Appears calm and comfortable, pale looking. Respiratory system: Clear to auscultation. Respiratory effort normal. Cardiovascular system: S1 & S2 heard, RRR. No JVD, murmurs, rubs, gallops or clicks. No pedal edema. Gastrointestinal system: Abdomen is nondistended, soft and nontender. No organomegaly or masses felt. Normal bowel sounds heard. Central nervous system: Alert and oriented. No focal neurological deficits. Extremities: Symmetric 5 x 5 power. Skin: No rashes, lesions or ulcers  Psychiatry: Judgement and insight appear normal. Mood & affect appropriate.     Data Reviewed: I have personally reviewed following labs and imaging studies  CBC: Recent Labs  Lab 03/29/19 2249 03/31/19 0015 04/01/19 0146  WBC 14.2* 12.6* 10.8*  NEUTROABS  --  9.2* 6.8  HGB 8.4* 7.7* 7.1*  HCT 29.2* 26.2* 24.6*  MCV 65.0* 66.0* 65.4*  PLT 312 254 224   Basic Metabolic Panel: Recent  Labs  Lab 03/29/19 2249 03/31/19 0015 04/01/19 0146  NA 130* 131* 134*  K 3.5 4.2 4.5  CL 97* 101 105  CO2 22 21* 19*  GLUCOSE 146* 114* 104*  BUN <5* <5* <5*  CREATININE 0.80 0.69 0.63  CALCIUM 9.1 8.5* 8.3*   GFR: Estimated Creatinine Clearance: 89.2 mL/min (by C-G formula based on SCr of 0.63 mg/dL). Liver Function Tests: Recent Labs  Lab 03/29/19 2249 03/31/19 0015  AST 22 17  ALT 11 11  ALKPHOS 41 42  BILITOT 0.3 0.3  PROT 8.4* 7.1  ALBUMIN 3.8 3.3*   Recent Labs  Lab 03/29/19 2249  LIPASE 41   No results for input(s): AMMONIA in the last 168 hours. Coagulation Profile: No results for input(s): INR, PROTIME in the last 168 hours. Cardiac Enzymes: No results for input(s): CKTOTAL, CKMB, CKMBINDEX, TROPONINI in the last 168 hours. BNP (last 3 results) No results for input(s): PROBNP in the last 8760 hours. HbA1C: No results for input(s): HGBA1C in the last 72 hours. CBG: No results for input(s): GLUCAP in the last 168 hours. Lipid Profile: No results for input(s): CHOL, HDL, LDLCALC, TRIG, CHOLHDL, LDLDIRECT in the last 72 hours. Thyroid Function Tests: No results for input(s): TSH, T4TOTAL, FREET4, T3FREE, THYROIDAB in the last 72 hours. Anemia Panel: Recent Labs    03/31/19 0536 03/31/19 0537  VITAMINB12 157*  --   FOLATE  --  22.4  FERRITIN 17  --   TIBC 300  --   IRON 6*  --   RETICCTPCT 0.8  --    Sepsis Labs: Recent Labs  Lab 03/31/19 0216  LATICACIDVEN 1.0    Recent Results (from the past 240 hour(s))  Urine culture     Status: Abnormal   Collection Time: 03/30/19 12:30 AM   Specimen: Urine, Clean Catch  Result Value Ref Range Status   Specimen Description URINE, CLEAN CATCH  Final   Special Requests   Final    NONE Performed at [facility], [address] [place], [place] [address]    Culture (A)  Final    >=100,000 COLONIES/mL ESCHERICHIA COLI Confirmed Extended Spectrum Beta-Lactamase Producer (ESBL).  In bloodstream  infections from ESBL organisms, carbapenems are preferred over piperacillin/tazobactam. They are shown to have a lower risk of mortality.    Report Status 03/31/2019 FINAL  Final   Organism ID, Bacteria ESCHERICHIA COLI (A)  Final      Susceptibility   Escherichia coli - MIC*    AMPICILLIN >=32 RESISTANT Resistant     CEFAZOLIN >=64 RESISTANT Resistant     CEFTRIAXONE >=64 RESISTANT Resistant     CIPROFLOXACIN >=4 RESISTANT Resistant     GENTAMICIN >=16 RESISTANT Resistant     IMIPENEM <=0.25 SENSITIVE Sensitive     NITROFURANTOIN <=16 SENSITIVE Sensitive     TRIMETH/SULFA >=320 RESISTANT Resistant     AMPICILLIN/SULBACTAM >=32 RESISTANT Resistant     PIP/TAZO >=128 RESISTANT Resistant     Extended ESBL POSITIVE Resistant     * >=100,000 COLONIES/mL ESCHERICHIA COLI  Radiology Studies: No results found.      Scheduled Meds: . sodium chloride flush  3 mL Intravenous Q12H  . vitamin B-12  1,000 mcg Oral Daily   Continuous Infusions: . sodium chloride 110 mL/hr at 04/01/19 0812  . iron dextran (INFED/DEXFERRUM) infusion    . meropenem (MERREM) IV 1 g (04/01/19 0532)     LOS: 0 days    Time spent: 25 minutes     Barb Merino, MD [facility] Pager [phone]  If 7PM-7AM, please contact night-coverage [email or web address] Password [email or web address] 04/01/2019, 11:10 AM

## 2019-04-02 LAB — CBC WITH DIFFERENTIAL/PLATELET
Abs Immature Granulocytes: 0.04 10*3/uL (ref 0.00–0.07)
Basophils Absolute: 0 10*3/uL (ref 0.0–0.1)
Basophils Relative: 0 %
Eosinophils Absolute: 0.1 10*3/uL (ref 0.0–0.5)
Eosinophils Relative: 1 %
HCT: 26.1 % — ABNORMAL LOW (ref 36.0–46.0)
Hemoglobin: 7.4 g/dL — ABNORMAL LOW (ref 12.0–15.0)
Immature Granulocytes: 0 %
Lymphocytes Relative: 29 %
Lymphs Abs: 2.7 10*3/uL (ref 0.7–4.0)
MCH: 18.7 pg — ABNORMAL LOW (ref 26.0–34.0)
MCHC: 28.4 g/dL — ABNORMAL LOW (ref 30.0–36.0)
MCV: 65.9 fL — ABNORMAL LOW (ref 80.0–100.0)
Monocytes Absolute: 0.8 10*3/uL (ref 0.1–1.0)
Monocytes Relative: 9 %
Neutro Abs: 5.6 10*3/uL (ref 1.7–7.7)
Neutrophils Relative %: 61 %
Platelets: 236 10*3/uL (ref 150–400)
RBC: 3.96 MIL/uL (ref 3.87–5.11)
RDW: 19.9 % — ABNORMAL HIGH (ref 11.5–15.5)
WBC: 9.2 10*3/uL (ref 4.0–10.5)
nRBC: 0 % (ref 0.0–0.2)

## 2019-04-02 MED ORDER — FERROUS SULFATE 325 (65 FE) MG PO TABS: 325.0000 mg | ORAL_TABLET | Freq: Three times a day (TID) | ORAL | 0 refills | Status: AC

## 2019-04-02 MED ORDER — FOSFOMYCIN TROMETHAMINE 3 G PO PACK: 3.0000 g | PACK | ORAL | 0 refills | Status: AC

## 2019-04-02 MED ORDER — CYANOCOBALAMIN 1000 MCG PO TABS: 1000.0000 ug | ORAL_TABLET | Freq: Every day | ORAL | 0 refills | Status: AC

## 2019-04-02 MED ORDER — POLYETHYLENE GLYCOL 3350 17 G PO PACK: 17.0000 g | PACK | Freq: Every day | ORAL | 0 refills | Status: DC | PRN

## 2019-04-02 MED ORDER — SENNOSIDES-DOCUSATE SODIUM 8.6-50 MG PO TABS: 1.0000 | ORAL_TABLET | Freq: Two times a day (BID) | ORAL | 0 refills | Status: AC

## 2019-04-02 NOTE — Progress Notes (Signed)
Pt discharged home in stable condition after going over discharge teaching with no complaints voiced

## 2019-04-02 NOTE — Discharge Summary (Signed)
Physician Discharge Summary  Diana Hensley KGU:[id number] DOB: Mar 18, 1991 DOA: 03/31/2019  PCP: Patient, No Pcp Per  Admit date: 03/31/2019 Discharge date: 04/02/2019  Admitted From: home  Disposition:  Home   Recommendations for Outpatient Follow-up:  1. Follow up with PCP in 1-2 weeks  Home Health:NA  Equipment/Devices:NA   Discharge Condition:stable   CODE STATUS:Full code  Diet recommendation: Regular diet, supplements, high-fiber diet to have regular bowel movements.  Discharge summary:  28 year old female with history of chronic microcytic anemia presented to the emergency room with fever, dysuria nausea and vomiting.  She was seen in the emergency room the night before and was given 1 dose of IV Rocephin, IV fluids and discharged on oral Keflex.  Patient continued to have symptoms and could not take any oral medications, temperature was 103 and she presented back to the emergency room.  She was admitted to the hospital and treated with IV antibiotics.  Subsequently, patient was found to have ESBL E. coli with resistant to most of the antibiotics.  Patient was treated with carboplatinum for 3 days with good clinical response.  Blood cultures were negative.  With good clinical improvement, as per infectious disease recommendation, patient was discharged with 2 doses of fosfomycin 3 g to take 3 days apart.   Patient was also found with iron deficiency anemia, hemoglobin 7.1, iron level 6, ferritin 17, B12 157.  Patient was given 1 dose of IV iron in the hospital, B12 injection thousand micrograms intramuscular 1 dose.  She will be prescribed ferrous sulfate and oral B12 supplementation on treatment dose.  Patient also has issues with constipation, she was advised to take over-the-counter laxatives and stool softeners.  Patient is clinically stable.  Medication availability was verified with her pharmacy and patient discharged.  Discharge Diagnoses:  Principal Problem:   Sepsis  secondary to UTI [facility]) Active Problems:   Microcytic anemia   Hyponatremia   Sepsis due to gram-negative UTI [facility])    Discharge Instructions  Discharge Instructions    Call MD for:  persistant nausea and vomiting   Complete by: As directed    Call MD for:  temperature >100.4   Complete by: As directed    Diet - low sodium heart healthy   Complete by: As directed    Increase activity slowly   Complete by: As directed      Allergies as of 04/02/2019   No Known Allergies     Medication List    STOP taking these medications   cephALEXin 500 MG capsule Commonly known as: KEFLEX   ondansetron 4 MG tablet Commonly known as: Zofran     TAKE these medications   cyanocobalamin 1000 MCG tablet Take 1 tablet (1,000 mcg total) by mouth daily. Start taking on: April 03, 2019   ferrous sulfate 325 (65 FE) MG tablet Take 1 tablet (325 mg total) by mouth 3 (three) times daily with meals.   fosfomycin 3 g Pack Commonly known as: MONUROL Take 3 g by mouth every 3 (three) days for 2 doses. Start taking on: April 03, 2019   polyethylene glycol 17 g packet Commonly known as: MIRALAX / GLYCOLAX Take 17 g by mouth daily as needed.   senna-docusate 8.6-50 MG tablet Commonly known as: Senokot-S Take 1 tablet by mouth 2 (two) times daily.       No Known Allergies  Consultations:  None.   Procedures/Studies:  No results found.    Subjective: Patient was seen and examined.  Denies any  complaints.  Denies any nausea vomiting.  No more fever.  No more dysuria.  Eager to go home.   Discharge Exam: Vitals:   04/01/19 2130 04/02/19 0429  BP: 93/60 106/66  Pulse: 78 69  Resp: 17 15  Temp: 99.1 F (37.3 C) 98.3 F (36.8 C)  SpO2: 100% 100%   Vitals:   04/01/19 0457 04/01/19 1337 04/01/19 2130 04/02/19 0429  BP: 127/80 101/66 93/60 106/66  Pulse: 95 77 78 69  Resp: 18 18 17 15   Temp: 99.3 F (37.4 C) 98.6 F (37 C) 99.1 F (37.3 C) 98.3 F (36.8 C)  TempSrc:  Oral Oral Oral Oral  SpO2: 100% 100% 100% 100%  Weight:      Height:        General: Pt is alert, awake, not in acute distress Cardiovascular: RRR, S1/S2 +, no rubs, no gallops Respiratory: CTA bilaterally, no wheezing, no rhonchi Abdominal: Soft, NT, ND, bowel sounds + Extremities: no edema, no cyanosis    The results of significant diagnostics from this hospitalization (including imaging, microbiology, ancillary and laboratory) are listed below for reference.     Microbiology: Recent Results (from the past 240 hour(s))  Urine culture     Status: Abnormal   Collection Time: 03/30/19 12:30 AM   Specimen: Urine, Clean Catch  Result Value Ref Range Status   Specimen Description URINE, CLEAN CATCH  Final   Special Requests   Final    NONE Performed at [facility], [address] [place], [place] [address]    Culture (A)  Final    >=100,000 COLONIES/mL ESCHERICHIA COLI Confirmed Extended Spectrum Beta-Lactamase Producer (ESBL).  In bloodstream infections from ESBL organisms, carbapenems are preferred over piperacillin/tazobactam. They are shown to have a lower risk of mortality.    Report Status 03/31/2019 FINAL  Final   Organism ID, Bacteria ESCHERICHIA COLI (A)  Final      Susceptibility   Escherichia coli - MIC*    AMPICILLIN >=32 RESISTANT Resistant     CEFAZOLIN >=64 RESISTANT Resistant     CEFTRIAXONE >=64 RESISTANT Resistant     CIPROFLOXACIN >=4 RESISTANT Resistant     GENTAMICIN >=16 RESISTANT Resistant     IMIPENEM <=0.25 SENSITIVE Sensitive     NITROFURANTOIN <=16 SENSITIVE Sensitive     TRIMETH/SULFA >=320 RESISTANT Resistant     AMPICILLIN/SULBACTAM >=32 RESISTANT Resistant     PIP/TAZO >=128 RESISTANT Resistant     Extended ESBL POSITIVE Resistant     * >=100,000 COLONIES/mL ESCHERICHIA COLI  Blood Culture (routine x 2)     Status: None (Preliminary result)   Collection Time: 03/31/19  2:16 AM   Specimen: BLOOD  Result Value Ref Range Status    Specimen Description BLOOD LEFT ANTECUBITAL  Final   Special Requests   Final    BOTTLES DRAWN AEROBIC AND ANAEROBIC Blood Culture adequate volume   Culture   Final    NO GROWTH 2 DAYS Performed at [facility], [address] [place], [place] [address]    Report Status PENDING  Incomplete  Blood Culture (routine x 2)     Status: None (Preliminary result)   Collection Time: 03/31/19  2:16 AM   Specimen: BLOOD  Result Value Ref Range Status   Specimen Description BLOOD RIGHT ANTECUBITAL  Final   Special Requests   Final    BOTTLES DRAWN AEROBIC AND ANAEROBIC Blood Culture adequate volume   Culture   Final    NO GROWTH 2  DAYS Performed at [facility], [address] [place], [place] [address]    Report Status PENDING  Incomplete  Novel Coronavirus,NAA,(SEND-OUT TO REF LAB - TAT 24-48 hrs); Hosp Order     Status: None   Collection Time: 03/31/19  2:35 AM   Specimen: Nasopharyngeal Swab; Respiratory  Result Value Ref Range Status   SARS-CoV-2, NAA NOT DETECTED NOT DETECTED Final    Comment: (NOTE) This test was developed and its performance characteristics determined by [facility]. This test has not been FDA cleared or approved. This test has been authorized by FDA under an Emergency Use Authorization (EUA). This test is only authorized for the duration of time the declaration that circumstances exist justifying the authorization of the emergency use of in vitro diagnostic tests for detection of SARS-CoV-2 virus and/or diagnosis of COVID-19 infection under section 564(b)(1) of the Act, 21 U.S.C. 604VWU-9(W)(1360bbb-3(b)(1), unless the authorization is terminated or revoked sooner. When diagnostic testing is negative, the possibility of a false negative result should be considered in the context of a patient's recent exposures and the presence of clinical signs and symptoms consistent with COVID-19. An individual without symptoms of COVID-19 and who is not shedding  SARS-CoV-2 virus would expect to have a negative (not detected) result in this assay. Performed  At: [facility] [address] [place], [place] [id number] Jolene SchimkeNagendra Sanjai MD AO:[id number]:[phone]    Coronavirus Source NASOPHARYNGEAL  Final    Comment: Performed at [facility], [address] [place], [place] [address]     Labs: BNP (last 3 results) No results for input(s): BNP in the last 8760 hours. Basic Metabolic Panel: Recent Labs  Lab 03/29/19 2249 03/31/19 0015 04/01/19 0146  NA 130* 131* 134*  K 3.5 4.2 4.5  CL 97* 101 105  CO2 22 21* 19*  GLUCOSE 146* 114* 104*  BUN <5* <5* <5*  CREATININE 0.80 0.69 0.63  CALCIUM 9.1 8.5* 8.3*   Liver Function Tests: Recent Labs  Lab 03/29/19 2249 03/31/19 0015  AST 22 17  ALT 11 11  ALKPHOS 41 42  BILITOT 0.3 0.3  PROT 8.4* 7.1  ALBUMIN 3.8 3.3*   Recent Labs  Lab 03/29/19 2249  LIPASE 41   No results for input(s): AMMONIA in the last 168 hours. CBC: Recent Labs  Lab 03/29/19 2249 03/31/19 0015 04/01/19 0146 04/02/19 0405  WBC 14.2* 12.6* 10.8* 9.2  NEUTROABS  --  9.2* 6.8 5.6  HGB 8.4* 7.7* 7.1* 7.4*  HCT 29.2* 26.2* 24.6* 26.1*  MCV 65.0* 66.0* 65.4* 65.9*  PLT 312 254 224 236   Cardiac Enzymes: No results for input(s): CKTOTAL, CKMB, CKMBINDEX, TROPONINI in the last 168 hours. BNP: Invalid input(s): POCBNP CBG: No results for input(s): GLUCAP in the last 168 hours. D-Dimer No results for input(s): DDIMER in the last 72 hours. Hgb A1c No results for input(s): HGBA1C in the last 72 hours. Lipid Profile No results for input(s): CHOL, HDL, LDLCALC, TRIG, CHOLHDL, LDLDIRECT in the last 72 hours. Thyroid function studies No results for input(s): TSH, T4TOTAL, T3FREE, THYROIDAB in the last 72 hours.  Invalid input(s): FREET3 Anemia work up Recent Labs    03/31/19 0536 03/31/19 0537  VITAMINB12 157*  --   FOLATE  --  22.4  FERRITIN 17  --   TIBC 300  --   IRON 6*  --   RETICCTPCT 0.8   --    Urinalysis    Component Value Date/Time   COLORURINE YELLOW  03/31/2019 0013   APPEARANCEUR HAZY (A) 03/31/2019 0013   LABSPEC 1.010 03/31/2019 0013   PHURINE 7.0 03/31/2019 0013   GLUCOSEU NEGATIVE 03/31/2019 0013   HGBUR NEGATIVE 03/31/2019 0013   BILIRUBINUR NEGATIVE 03/31/2019 0013   KETONESUR 20 (A) 03/31/2019 0013   PROTEINUR NEGATIVE 03/31/2019 0013   NITRITE NEGATIVE 03/31/2019 0013   LEUKOCYTESUR MODERATE (A) 03/31/2019 0013   Sepsis Labs Invalid input(s): PROCALCITONIN,  WBC,  LACTICIDVEN Microbiology Recent Results (from the past 240 hour(s))  Urine culture     Status: Abnormal   Collection Time: 03/30/19 12:30 AM   Specimen: Urine, Clean Catch  Result Value Ref Range Status   Specimen Description URINE, CLEAN CATCH  Final   Special Requests   Final    NONE Performed at [facility], [address] [place], [place] [address]    Culture (A)  Final    >=100,000 COLONIES/mL ESCHERICHIA COLI Confirmed Extended Spectrum Beta-Lactamase Producer (ESBL).  In bloodstream infections from ESBL organisms, carbapenems are preferred over piperacillin/tazobactam. They are shown to have a lower risk of mortality.    Report Status 03/31/2019 FINAL  Final   Organism ID, Bacteria ESCHERICHIA COLI (A)  Final      Susceptibility   Escherichia coli - MIC*    AMPICILLIN >=32 RESISTANT Resistant     CEFAZOLIN >=64 RESISTANT Resistant     CEFTRIAXONE >=64 RESISTANT Resistant     CIPROFLOXACIN >=4 RESISTANT Resistant     GENTAMICIN >=16 RESISTANT Resistant     IMIPENEM <=0.25 SENSITIVE Sensitive     NITROFURANTOIN <=16 SENSITIVE Sensitive     TRIMETH/SULFA >=320 RESISTANT Resistant     AMPICILLIN/SULBACTAM >=32 RESISTANT Resistant     PIP/TAZO >=128 RESISTANT Resistant     Extended ESBL POSITIVE Resistant     * >=100,000 COLONIES/mL ESCHERICHIA COLI  Blood Culture (routine x 2)     Status: None (Preliminary result)   Collection Time: 03/31/19  2:16 AM   Specimen:  BLOOD  Result Value Ref Range Status   Specimen Description BLOOD LEFT ANTECUBITAL  Final   Special Requests   Final    BOTTLES DRAWN AEROBIC AND ANAEROBIC Blood Culture adequate volume   Culture   Final    NO GROWTH 2 DAYS Performed at [facility], [address] [place], [place] [address]    Report Status PENDING  Incomplete  Blood Culture (routine x 2)     Status: None (Preliminary result)   Collection Time: 03/31/19  2:16 AM   Specimen: BLOOD  Result Value Ref Range Status   Specimen Description BLOOD RIGHT ANTECUBITAL  Final   Special Requests   Final    BOTTLES DRAWN AEROBIC AND ANAEROBIC Blood Culture adequate volume   Culture   Final    NO GROWTH 2 DAYS Performed at [facility], [address] [place], [place] [address]    Report Status PENDING  Incomplete  Novel Coronavirus,NAA,(SEND-OUT TO REF LAB - TAT 24-48 hrs); Hosp Order     Status: None   Collection Time: 03/31/19  2:35 AM   Specimen: Nasopharyngeal Swab; Respiratory  Result Value Ref Range Status   SARS-CoV-2, NAA NOT DETECTED NOT DETECTED Final    Comment: (NOTE) This test was developed and its performance characteristics determined by [facility]. This test has not been FDA cleared or approved. This test has been authorized by FDA under an Emergency Use Authorization (EUA). This test is only authorized for the duration of time the  declaration that circumstances exist justifying the authorization of the emergency use of in vitro diagnostic tests for detection of SARS-CoV-2 virus and/or diagnosis of COVID-19 infection under section 564(b)(1) of the Act, 21 U.S.C. 161WRU-0(A)(5), unless the authorization is terminated or revoked sooner. When diagnostic testing is negative, the possibility of a false negative result should be considered in the context of a patient's recent exposures and the presence of clinical signs and symptoms consistent with COVID-19. An individual without symptoms  of COVID-19 and who is not shedding SARS-CoV-2 virus would expect to have a negative (not detected) result in this assay. Performed  At: [facility] [address] [place], [place] [id number] Jolene Schimke MD NW:[id number]    Coronavirus Source NASOPHARYNGEAL  Final    Comment: Performed at [facility], [address], [place], [place] [address]     Time coordinating discharge: 35 minutes  SIGNED:   Dorcas Carrow, MD  [facility] 04/02/2019, 12:28 PM

## 2019-04-02 NOTE — Care Management (Signed)
Assistance requested by MD for DC planning to fill script for Fosfomycin.  [facility] on [address] has one dose in stock and can order/receive 2nd dose in 3 days time.  Patient has insurance on file and has some coverage by insurance.  Patient's cost will be $175 for 2 doses.  Discussed with patient and husband, who agree that they can pay this amount and will be able to fill prescription today.  Dr. Sloan Leiter aware.

## 2019-04-04 LAB — PATHOLOGIST SMEAR REVIEW

## 2019-04-05 LAB — CULTURE, BLOOD (ROUTINE X 2)
Culture: NO GROWTH
Culture: NO GROWTH
Special Requests: ADEQUATE
Special Requests: ADEQUATE

## 2019-04-20 ENCOUNTER — Encounter: Payer: Self-pay | Admitting: General Practice

## 2019-06-17 ENCOUNTER — Telehealth: Payer: PRIVATE HEALTH INSURANCE | Admitting: Physician Assistant

## 2019-06-17 DIAGNOSIS — R300 Dysuria: Secondary | ICD-10-CM

## 2019-06-17 NOTE — Progress Notes (Signed)
Based on what you shared with me, I feel your condition warrants further evaluation and I recommend that you be seen for a face to face office visit.  NOTE: If you entered your credit card information for this eVisit, you will not be charged. You may see a "hold" on your card for the $35 but that hold will drop off and you will not have a charge processed.  If you are having a true medical emergency please call 911.     For an urgent face to face visit, [facility] has four urgent care centers for your convenience:   . [facility]    [phone]                  Get Driving Directions  [address] [place], [place] [address] . 10 am to 8 pm Monday-Friday . 12 pm to 8 pm Saturday-Sunday   . [facility]  [phone]                  Get Driving Directions  [address], [address] , [place] [address] . 8 am to 8 pm Monday-Friday . 9 am to 6 pm Saturday . 11 am to 6 pm Sunday   . [facility] [place]  [phone]                  Get Driving Directions   [address] [place], [place] [address] . 8 am to 8 pm Monday-Friday . 8 am to 4 pm Saturday-Sunday    . [facility]                    Get Driving Directions  [phone]  [address] [place], [place] [address]  . Monday-Friday, 12 PM to 6 PM    Your e-visit answers were reviewed by a board certified advanced clinical practitioner to complete your personal care plan.  Thank you for using e-Visits.  Greater than 5 minutes, yet less than 10 minutes of time have been spent researching, coordinating, and implementing care for this patient today 

## 2019-06-24 ENCOUNTER — Encounter (HOSPITAL_COMMUNITY): Payer: Self-pay

## 2019-06-24 ENCOUNTER — Ambulatory Visit (HOSPITAL_COMMUNITY)
Admission: EM | Admit: 2019-06-24 | Discharge: 2019-06-24 | Disposition: A | Discharge status: Home / Self Care | Payer: PRIVATE HEALTH INSURANCE | Attending: Emergency Medicine | Admitting: Emergency Medicine

## 2019-06-24 ENCOUNTER — Telehealth (HOSPITAL_COMMUNITY): Payer: Self-pay | Admitting: Emergency Medicine

## 2019-06-24 ENCOUNTER — Other Ambulatory Visit: Payer: Self-pay

## 2019-06-24 DIAGNOSIS — N10 Acute tubulo-interstitial nephritis: Secondary | ICD-10-CM | POA: Insufficient documentation

## 2019-06-24 DIAGNOSIS — Z3202 Encounter for pregnancy test, result negative: Secondary | ICD-10-CM

## 2019-06-24 DIAGNOSIS — Z20828 Contact with and (suspected) exposure to other viral communicable diseases: Secondary | ICD-10-CM | POA: Insufficient documentation

## 2019-06-24 LAB — CBC
HCT: 39.2 % (ref 36.0–46.0)
Hemoglobin: 13.4 g/dL (ref 12.0–15.0)
MCH: 28 pg (ref 26.0–34.0)
MCHC: 34.2 g/dL (ref 30.0–36.0)
MCV: 81.8 fL (ref 80.0–100.0)
Platelets: 250 10*3/uL (ref 150–400)
RBC: 4.79 MIL/uL (ref 3.87–5.11)
RDW: 17.2 % — ABNORMAL HIGH (ref 11.5–15.5)
WBC: 9.9 10*3/uL (ref 4.0–10.5)
nRBC: 0 % (ref 0.0–0.2)

## 2019-06-24 LAB — POCT PREGNANCY, URINE: Preg Test, Ur: NEGATIVE

## 2019-06-24 MED ORDER — IBUPROFEN 800 MG PO TABS
ORAL_TABLET | ORAL | Status: AC
  Filled 2019-06-24: qty 1

## 2019-06-24 MED ORDER — CEFTRIAXONE SODIUM 1 G IJ SOLR
INTRAMUSCULAR | Status: AC
  Filled 2019-06-24: qty 10

## 2019-06-24 MED ORDER — CEFTRIAXONE SODIUM 1 G IJ SOLR
1.0000 g | Freq: Once | INTRAMUSCULAR | Status: AC
  Administered 2019-06-24: 1 g via INTRAMUSCULAR

## 2019-06-24 MED ORDER — LIDOCAINE HCL 2 % IJ SOLN
INTRAMUSCULAR | Status: AC
  Filled 2019-06-24: qty 20

## 2019-06-24 MED ORDER — CEPHALEXIN 500 MG PO CAPS: 500.0000 mg | ORAL_CAPSULE | Freq: Four times a day (QID) | ORAL | 0 refills | Status: AC

## 2019-06-24 MED ORDER — IBUPROFEN 800 MG PO TABS
800.0000 mg | ORAL_TABLET | Freq: Once | ORAL | Status: AC
  Administered 2019-06-24: 800 mg via ORAL

## 2019-06-24 NOTE — Telephone Encounter (Signed)
Patient contacted and made aware of  cbc  results, all questions answered, will monitor symptoms for next 48 hours and will go to ER if worse or not better

## 2019-06-24 NOTE — ED Provider Notes (Addendum)
[place]    CSN: [id number] Arrival date & time: 06/24/19  1124      History   Chief Complaint Chief Complaint  Patient presents with  . Fever    HPI Diana Hensley is a 28 y.o. female history of anemia presenting today for evaluation of fever chills and body aches.  Over the past 2 days patient has developed fevers, body aches especially in her mid lower back.  Denies any fall or injury.  She has had some occasional dysuria.  She notes that a few months ago she was admitted to the hospital for sepsis due to pyelonephritis.  Patient has not taken anything for her symptoms.  Denies any URI symptoms of cough, congestion or sore throat.  Feels similar to when she had Pyelo.  HPI  Past Medical History:  Diagnosis Date  . Anal fissure     Patient Active Problem List   Diagnosis Date Noted  . Sepsis due to gram-negative UTI ([place]) 04/01/2019  . Sepsis secondary to UTI ([place]) 03/31/2019  . Microcytic anemia 03/31/2019  . Hyponatremia 03/31/2019    History reviewed. No pertinent surgical history.  OB History   No obstetric history on file.      Home Medications    Prior to Admission medications   Medication Sig Start Date End Date Taking? Authorizing Provider  cephALEXin (KEFLEX) 500 MG capsule Take 1 capsule (500 mg total) by mouth 4 (four) times daily for 7 days. 06/24/19 07/01/19  Jalyn Dutta C, PA-C  ferrous sulfate 325 (65 FE) MG tablet Take 1 tablet (325 mg total) by mouth 3 (three) times daily with meals. 04/02/19 05/02/19  Barb Merino, MD  polyethylene glycol (MIRALAX / GLYCOLAX) 17 g packet Take 17 g by mouth daily as needed. 04/02/19   Barb Merino, MD    Family History Family History  Problem Relation Age of Onset  . Healthy Mother   . Healthy Father     Social History Social History   Tobacco Use  . Smoking status: Never Smoker  . Smokeless tobacco: Never Used  Substance Use Topics  . Alcohol use: Never    Frequency: Never  .  Drug use: Never     Allergies   Patient has no known allergies.   Review of Systems Review of Systems  Constitutional: Positive for chills, fatigue and fever.  Respiratory: Negative for shortness of breath.   Cardiovascular: Negative for chest pain.  Gastrointestinal: Negative for abdominal pain, diarrhea, nausea and vomiting.  Genitourinary: Positive for dysuria. Negative for flank pain, genital sores, hematuria, menstrual problem, vaginal bleeding, vaginal discharge and vaginal pain.  Musculoskeletal: Negative for back pain.  Skin: Negative for rash.  Neurological: Negative for dizziness, light-headedness and headaches.     Physical Exam Triage Vital Signs ED Triage Vitals  Enc Vitals Group     BP 06/24/19 1231 101/69     Pulse Rate 06/24/19 1231 (!) 119     Resp --      Temp 06/24/19 1231 (!) 101.8 F (38.8 C)     Temp Source 06/24/19 1231 Oral     SpO2 06/24/19 1231 99 %     Weight 06/24/19 1232 119 lb 0.8 oz (54 kg)     Height --      Head Circumference --      Peak Flow --      Pain Score 06/24/19 1232 9     Pain Loc --      Pain Edu? --  Excl. in GC? --    No data found.  Updated Vital Signs BP 101/69 (BP Location: Right Arm)   Pulse (!) 119   Temp (!) 101.8 F (38.8 C) (Oral)   Wt 119 lb 0.8 oz (54 kg)   SpO2 99%   BMI 20.43 kg/m   Visual Acuity Right Eye Distance:   Left Eye Distance:   Bilateral Distance:    Right Eye Near:   Left Eye Near:    Bilateral Near:     Physical Exam Vitals signs and nursing note reviewed.  Constitutional:      General: She is not in acute distress.    Appearance: She is well-developed.  HENT:     Head: Normocephalic and atraumatic.  Eyes:     Conjunctiva/sclera: Conjunctivae normal.  Neck:     Musculoskeletal: Neck supple.  Cardiovascular:     Rate and Rhythm: Normal rate and regular rhythm.     Heart sounds: No murmur.  Pulmonary:     Effort: Pulmonary effort is normal. No respiratory distress.      Breath sounds: Normal breath sounds.     Comments: Breathing comfortably at rest, CTABL, no wheezing, rales or other adventitious sounds auscultated Abdominal:     Palpations: Abdomen is soft.     Tenderness: There is no abdominal tenderness.     Comments: Soft, nondistended, nontender to light and to palpation throughout abdomen  Musculoskeletal:     Comments: Mild tenderness to palpation mid lower back  Skin:    General: Skin is warm and dry.  Neurological:     Mental Status: She is alert.      UC Treatments / Results  Labs (all labs ordered are listed, but only abnormal results are displayed) Labs Reviewed  CBC - Abnormal; Notable for the following components:      Result Value   RDW 17.2 (*)    All other components within normal limits  NOVEL CORONAVIRUS, NAA (HOSP ORDER, SEND-OUT TO REF LAB; TAT 18-24 HRS)  URINE CULTURE  POC URINE PREG, ED  POCT PREGNANCY, URINE    EKG   Radiology No results found.  Procedures Procedures (including critical care time)  Medications Ordered in UC Medications  cefTRIAXone (ROCEPHIN) injection 1 g (1 g Intramuscular Given 06/24/19 1336)  ibuprofen (ADVIL) tablet 800 mg (800 mg Oral Given 06/24/19 1336)  ibuprofen (ADVIL) 800 MG tablet (has no administration in time range)  cefTRIAXone (ROCEPHIN) 1 g injection (has no administration in time range)  lidocaine (XYLOCAINE) 2 % (with pres) injection (has no administration in time range)    Initial Impression / Assessment and Plan / UC Course  I have reviewed the triage vital signs and the nursing notes.  Pertinent labs & imaging results that were available during my care of the patient were reviewed by me and considered in my medical decision making (see chart for details).    UA with large leuks, given fever and tachycardia will treat for pyelonephritis.  Will do trial of outpatient therapy first, providing gram of Rocephin prior to discharge and will initiate on Keflex.  Previous  culture has significant resistance and opted for Keflex as best option to cover for pyelonephritis as well.  Advised if patient does not have any improvement in the next 24 to 48 hours to follow-up in the emergency room as she may likely need IV antibiotics.  Obtaining CBC to check white count for further clarification of need for sooner evaluation in ED versus monitoring  over the next 24 to 48 hours as well as rechecking hemoglobin as patient previously had anemia and has been taking iron supplements since.  White blood cell count 9.9, monitor over the next 1 to 2 days. Hemoglobin stable.  Improved from previous count.  Final Clinical Impressions(s) / UC Diagnoses   Final diagnoses:  Acute pyelonephritis     Discharge Instructions     Urine suggestive of urinary tract infection- likely kidney infection with your fever We gave you a shot of Rocephin Begin Keflex as prescribed over the next week If you do not see any improvement in the next 48 hours please follow-up in the emergency room as you may need IV antibiotics      ED Prescriptions    Medication Sig Dispense Auth. Provider   cephALEXin (KEFLEX) 500 MG capsule Take 1 capsule (500 mg total) by mouth 4 (four) times daily for 7 days. 28 capsule Mithra Spano, [place] C, PA-C     PDMP not reviewed this encounter.   Lew Dawes, PA-C 06/24/19 1510    Heidemarie Goodnow, [place] C, PA-C 06/24/19 1510

## 2019-06-24 NOTE — Telephone Encounter (Signed)
Per hallie, labs unremarkable. Pt needs to go to the ER if not getting better after 48 hours. Attempted to reach patient. No answer at this time. Mychart comment sent.

## 2019-06-24 NOTE — ED Notes (Signed)
Urinalysis exceeded the limitations of the clinitek capabilities. Notified provider

## 2019-06-24 NOTE — Discharge Instructions (Signed)
Urine suggestive of urinary tract infection- likely kidney infection with your fever We gave you a shot of Rocephin Begin Keflex as prescribed over the next week If you do not see any improvement in the next 48 hours please follow-up in the emergency room as you may need IV antibiotics

## 2019-06-24 NOTE — ED Triage Notes (Signed)
Pt. States she has had fever, body aches, back pain & very fatigue for 2 days.

## 2019-06-26 LAB — URINE CULTURE: Culture: >=100000 — AB

## 2019-06-26 LAB — NOVEL CORONAVIRUS, NAA (HOSP ORDER, SEND-OUT TO REF LAB; TAT 18-24 HRS): SARS-CoV-2, NAA: NOT DETECTED

## 2019-06-27 ENCOUNTER — Telehealth (HOSPITAL_COMMUNITY): Payer: Self-pay | Admitting: Emergency Medicine

## 2019-06-27 NOTE — Telephone Encounter (Signed)
Reviewed culture report with Hallie, pt is taking Keflex, pt states she has no symptoms at all, feels back to normal, per [facility], pt needs to finish antibiotics completely, and follow up if symptoms return. Pt agreeable to plan, all questions answered.

## 2019-07-05 ENCOUNTER — Encounter: Payer: Self-pay | Admitting: Physician Assistant

## 2019-07-14 ENCOUNTER — Other Ambulatory Visit (INDEPENDENT_AMBULATORY_CARE_PROVIDER_SITE_OTHER): Payer: PRIVATE HEALTH INSURANCE

## 2019-07-14 ENCOUNTER — Other Ambulatory Visit: Payer: Self-pay

## 2019-07-14 ENCOUNTER — Ambulatory Visit (INDEPENDENT_AMBULATORY_CARE_PROVIDER_SITE_OTHER): Payer: PRIVATE HEALTH INSURANCE | Admitting: Physician Assistant

## 2019-07-14 ENCOUNTER — Encounter: Payer: Self-pay | Admitting: Physician Assistant

## 2019-07-14 VITALS — BP 100/70 | HR 96 | Temp 98.1°F | Ht 63.0 in | Wt 122.0 lb

## 2019-07-14 DIAGNOSIS — K602 Anal fissure, unspecified: Secondary | ICD-10-CM

## 2019-07-14 DIAGNOSIS — Z862 Personal history of diseases of the blood and blood-forming organs and certain disorders involving the immune mechanism: Secondary | ICD-10-CM

## 2019-07-14 MED ORDER — AMBULATORY NON FORMULARY MEDICATION: 1 refills | Status: DC

## 2019-07-14 NOTE — Patient Instructions (Signed)
We have given you a printed prescription for Diltiazem gel to [facility]. You should apply a pea size amount to your rectum three times daily x 6-8 weeks.  [facility]'s information is below: Address: [place], [place] [address]  [phone]  Take Miralax 17gram in 8 oz water daily if needed for constipation  Go to the basement for labs today  If you are age 26 or older, your body mass index should be between 23-30. Your Body mass index is 21.61 kg/m. If this is out of the aforementioned range listed, please consider follow up with your Primary Care Provider.  If you are age 83 or younger, your body mass index should be between 19-25. Your Body mass index is 21.61 kg/m. If this is out of the aformentioned range listed, please consider follow up with your Primary Care Provider.    I appreciate the  opportunity to care for you  Thank You   Amy Shane Crutch

## 2019-07-14 NOTE — Progress Notes (Signed)
Subjective:    Patient ID: Diana Hensley, female    DOB: 08-29-1991, 28 y.o.   MRN: [id number]  HPI Diana Hensley is a pleasant 28 year old [place] female, new to GI today, self-referred for evaluation of persistent anal rectal pain. Patient has history of pyelonephritis for which she was hospitalized once earlier this year and had recurrent episode in October.  She also has history of severe iron deficiency anemia.  Labs from July 2020 showed hemoglobin of 7.4 hematocrit of 26.1 MCV of 65, serum iron 6 TIBC 300 iron sat of 2 and ferritin of 17.  She and her husband states she has been on iron supplementation. Labs were done October 2020 showing hemoglobin of 13.4 hematocrit of 39.2 and MCV of 86. They are not sure what was felt to be the etiology of the anemia.  She says she does have fairly heavy periods however they usually only last for 3 or 4 days. She says she had been having rectal pain and small-volume rectal bleeding for about 6 months prior to being evaluated in [place] while she was visiting there.  She brought with her a report from a Careers adviser in [place].  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 Patient says she has never been given any medication to help heal the fissure.  She does not really feel that her symptoms improved after that procedure she has continued to have ongoing rectal pain.  She says she is starting to feel depressed because she cannot seem to get better.  She has pain with every bowel movement and afterward and continues to see bright red blood off and on.  She had been having some problems with constipation which has been chronic.  She is now taking probiotics and says that is helping she is currently not having any difficulty with hard stools or straining.  She describes rectal pain burning and itching, with symptoms in total now present over the past 10 months.  Review of Systems Pertinent positive and negative review of systems were noted in  the above HPI section.  All other review of systems was otherwise negative.  Outpatient Encounter Medications as of 07/14/2019  Medication Sig  . Probiotic Product (PROBIOTIC DAILY PO) Take 1 tablet by mouth daily.  . AMBULATORY NON FORMULARY MEDICATION Medication Name: Diltiazem ointment 2%/ with Lidocaine 5%   Apply per rectum 4 times daily for 6-8 weeks  . ferrous sulfate 325 (65 FE) MG tablet Take 1 tablet (325 mg total) by mouth 3 (three) times daily with meals.  . [DISCONTINUED] polyethylene glycol (MIRALAX / GLYCOLAX) 17 g packet Take 17 g by mouth daily as needed.   No facility-administered encounter medications on file as of 07/14/2019.    No Known Allergies Patient Active Problem List   Diagnosis Date Noted  . Sepsis due to gram-negative UTI (HCC) 04/01/2019  . Sepsis secondary to UTI (HCC) 03/31/2019  . Microcytic anemia 03/31/2019  . Hyponatremia 03/31/2019   Social History   Socioeconomic History  . Marital status: Married    Spouse name: Not on file  . Number of children: Not on file  . Years of education: Not on file  . Highest education level: Not on file  Occupational History  . Not on file  Social Needs  . Financial resource strain: Not on file  . Food insecurity    Worry: Not on file    Inability: Not on file  . Transportation needs  Medical: Not on file    Non-medical: Not on file  Tobacco Use  . Smoking status: Never Smoker  . Smokeless tobacco: Never Used  Substance and Sexual Activity  . Alcohol use: Never    Frequency: Never  . Drug use: Never  . Sexual activity: Not on file  Lifestyle  . Physical activity    Days per week: Not on file    Minutes per session: Not on file  . Stress: Not on file  Relationships  . Social Musicianconnections    Talks on phone: Not on file    Gets together: Not on file    Attends religious service: Not on file    Active member of club or organization: Not on file    Attends meetings of clubs or organizations: Not  on file    Relationship status: Not on file  . Intimate partner violence    Fear of current or ex partner: Not on file    Emotionally abused: Not on file    Physically abused: Not on file    Forced sexual activity: Not on file  Other Topics Concern  . Not on file  Social History Narrative  . Not on file    Ms. Beckner's family history includes Healthy in her father and mother.      Objective:    Vitals:   07/14/19 1127  BP: 100/70  Pulse: 96  Temp: 98.1 F (36.7 C)    Physical Exam Well-developed well-nourished young [place] in no acute distress.  Accompanied by her husband height, Weight, 122 BMI 21.6  HEENT; nontraumatic normocephalic, EOMI, PER R LA, sclera anicteric. Oropharynx; not examined/mask/Covid Neck; supple, no JVD Cardiovascular; regular rate and rhythm with S1-S2, no murmur rub or gallop Pulmonary; Clear bilaterally Abdomen; soft, nontender, nondistended, no palpable mass or hepatosplenomegaly, bowel sounds are active Rectal; sentinel tag, on digital exam she is quite tender and there is a palpable fissure on the left.  On anoscopy she has a fairly deep anal fissure at about the 10 o'clock position Skin; benign exam, no jaundice rash or appreciable lesions Extremities; no clubbing cyanosis or edema skin warm and dry Neuro/Psych; alert and oriented x4, grossly nonfocal mood and affect appropriate       Assessment & Plan:   #361  28 year old [place] female with persistent anal fissure and intermittent small-volume hematochezia. Patient has been having symptoms in total for about 10 months, had not received any medication for treatment but did undergo a laser sphincterolysis procedure in June 2020 while she was in [place]. She did not have any improvement in symptoms after that procedure and symptoms have persisted.  On exam today she has a fairly deep anal fissure on the left.  #2 recent diagnosis of iron deficiency anemia, received oral iron replacement most  recent hemoglobin back to normal at 13.4 Etiology of anemia not clear, she has been having ongoing rectal bleeding and also has heavy menses.  #3 history of recurrent pyelonephritis  Plan; start diltiazem 2%/compounded with lidocaine 5% apply 4 times daily 1/2 to 1 inch into the anus.  Patient was advised that this may take multiple weeks to heal and to continue regimen for the next 6 to 8 weeks. To new probiotics, also advised stool softener and/or MiraLAX 17 g in 8 ounces of water daily as needed. Repeat iron studies today, if persistent iron deficiency, may need to consider further GI evaluation. We will plan to see patient back in the office in about 4  weeks, she is asked to call back in 2 to 3 weeks if she is not having any significant improvement in symptoms.   Naama Sappington S Takeem Krotzer PA-C 07/14/2019   Cc: No ref. provider found

## 2019-07-15 LAB — IBC + FERRITIN
Ferritin: 19.6 ng/mL (ref 10.0–291.0)
Iron: 78 ug/dL (ref 42–145)
Saturation Ratios: 22.6 % (ref 20.0–50.0)
Transferrin: 246 mg/dL (ref 212.0–360.0)

## 2019-07-17 NOTE — Progress Notes (Signed)
Reviewed. I agree with documentation including the assessment and plan. Given the ferritin <30, would recommend treatment of anal fissure and repeating iron deficiency labs (CBC, iron, ferritin)  in 6 months. May need additional evaluation and treatment if it remains low.   Niesha Bame L. Tarri Glenn, MD, MPH

## 2019-08-03 ENCOUNTER — Other Ambulatory Visit: Payer: Self-pay | Admitting: Physician Assistant

## 2019-08-04 NOTE — Telephone Encounter (Signed)
Would you like to refill the diltiazem? I have looked at the last note and she contacted [place] back as per your request. Please advise

## 2019-08-09 NOTE — Telephone Encounter (Signed)
Yes - refill - then 2 refills

## 2019-08-10 MED ORDER — AMBULATORY NON FORMULARY MEDICATION: 2 refills | Status: DC

## 2019-08-10 NOTE — Addendum Note (Signed)
Addended by: Lanny Hurst A on: 08/10/2019 10:36 AM   Modules accepted: Orders

## 2019-08-16 ENCOUNTER — Encounter: Payer: Self-pay | Admitting: Physician Assistant

## 2019-08-16 ENCOUNTER — Ambulatory Visit (INDEPENDENT_AMBULATORY_CARE_PROVIDER_SITE_OTHER): Payer: PRIVATE HEALTH INSURANCE | Admitting: Physician Assistant

## 2019-08-16 VITALS — BP 106/64 | HR 67 | Temp 97.9°F | Ht 64.0 in | Wt 124.0 lb

## 2019-08-16 DIAGNOSIS — K602 Anal fissure, unspecified: Secondary | ICD-10-CM | POA: Diagnosis not present

## 2019-08-16 MED ORDER — AMBULATORY NON FORMULARY MEDICATION: 3 refills | Status: AC

## 2019-08-16 NOTE — Progress Notes (Signed)
Reviewed and agree with management plans. ? ?Marquel Spoto L. Jenie Parish, MD, MPH  ?

## 2019-08-16 NOTE — Patient Instructions (Signed)
If you are age 26 or older, your body mass index should be between 23-30. Your Body mass index is 21.28 kg/m. If this is out of the aforementioned range listed, please consider follow up with your Primary Care Provider.  If you are age 44 or younger, your body mass index should be between 19-25. Your Body mass index is 21.28 kg/m. If this is out of the aformentioned range listed, please consider follow up with your Primary Care Provider.   We have sent the following medications to your pharmacy for you to pick up at your convenience:  Diltiazem Gel - Apply 3-4 times daily.  Try Senokot 1-2 at bedtime to avoid straining.  Add Miralax one dose daily as needed for constipation.  You are being referred to [facility] - Dr. Lendon Collar for consultation of non-healing fissure.  Thank you for choosing me and [facility].   Amy Esterwood, PA-C

## 2019-08-16 NOTE — Progress Notes (Signed)
Subjective:    Patient ID: Diana Hensley, female    DOB: [birth year], 28 y.o.   MRN: [id number]  HPI  Emaley is a pleasant 28 year old [place] female, who was initially seen by myself on 07/14/2019.  She is now established with Dr. Orvan Falconer.  She has a persistent anal fissure, and comes back in today for follow-up. When she was initially seen a little over a month ago she had been having symptoms for about 10 months with rectal pain, intermittent streaks of hematochezia.  She had been in [place] in June 2020 and underwent a laser sphincterolysis procedure.  She related that her symptoms did not improve at all after this procedure.  She had not been on any medication. She was started on diltiazem 2% compounded with lidocaine 5% 4 times daily, and MiraLAX as needed for constipation. She also has prior history of iron deficiency anemia however she had taken iron replacement and with repeat labs on 07/14/2019 serum iron 78 transferrin 246 iron sat of 22 and ferritin of 19.6.  Her hemoglobin had previously returned to normal at 13.4.   Today she says she is not sure that she is really any better.  She is seeing what she describes as "drops" of blood with each bowel movement over the past 10 days.  She says her pain is about the same and is fairly constant but worse after bowel movements.  She has been using the medication as directed.  She has had occasional straining and harder stools.  On further questioning she admits to some right lower quadrant discomfort intermittently over the past couple of weeks.  No diarrhea, no rectal drainage.  Review of Systems Pertinent positive and negative review of systems were noted in the above HPI section.  All other review of systems was otherwise negative.  Outpatient Encounter Medications as of 08/16/2019  Medication Sig  . AMBULATORY NON FORMULARY MEDICATION Medication Name: Diltiazem ointment 2%/ with Lidocaine 5%   Apply per rectum 3- 4 times daily.  Marland Kitchen lidocaine  (XYLOCAINE) 5 % ointment APPLY RECTALLY 4 TIMES A DAY FOR 6-8 WEEKS.  . Probiotic Product (PROBIOTIC DAILY PO) Take 1 tablet by mouth daily.  . [DISCONTINUED] AMBULATORY NON FORMULARY MEDICATION Medication Name: Diltiazem ointment 2%/ with Lidocaine 5%   Apply per rectum 4 times daily for 6-8 weeks  . ferrous sulfate 325 (65 FE) MG tablet Take 1 tablet (325 mg total) by mouth 3 (three) times daily with meals. (Patient taking differently: Take 325 mg by mouth daily with breakfast. )   No facility-administered encounter medications on file as of 08/16/2019.    No Known Allergies Patient Active Problem List   Diagnosis Date Noted  . Sepsis due to gram-negative UTI (HCC) 04/01/2019  . Sepsis secondary to UTI (HCC) 03/31/2019  . Microcytic anemia 03/31/2019  . Hyponatremia 03/31/2019   Social History   Socioeconomic History  . Marital status: Married    Spouse name: Not on file  . Number of children: Not on file  . Years of education: Not on file  . Highest education level: Not on file  Occupational History  . Not on file  Social Needs  . Financial resource strain: Not on file  . Food insecurity    Worry: Not on file    Inability: Not on file  . Transportation needs    Medical: Not on file    Non-medical: Not on file  Tobacco Use  . Smoking status: Never Smoker  . Smokeless tobacco:  Never Used  Substance and Sexual Activity  . Alcohol use: Never    Frequency: Never  . Drug use: Never  . Sexual activity: Not on file  Lifestyle  . Physical activity    Days per week: Not on file    Minutes per session: Not on file  . Stress: Not on file  Relationships  . Social Herbalist on phone: Not on file    Gets together: Not on file    Attends religious service: Not on file    Active member of club or organization: Not on file    Attends meetings of clubs or organizations: Not on file    Relationship status: Not on file  . Intimate partner violence    Fear of current or  ex partner: Not on file    Emotionally abused: Not on file    Physically abused: Not on file    Forced sexual activity: Not on file  Other Topics Concern  . Not on file  Social History Narrative  . Not on file    Ms. Helmers's family history includes Healthy in her father and mother.      Objective:    Vitals:   08/16/19 1318  BP: 106/64  Pulse: 67  Temp: 97.9 F (36.6 C)    Physical Exam; Well-developed well-nourished young female in no acute distress.  Height, Weight,124 BMI 21.2  HEENT; nontraumatic normocephalic, EOMI, PER R LA, sclera anicteric. Oropharynx;not examined /mask/covid Neck; supple, no JVD Cardiovascular; regular rate and rhythm with S1-S2, no murmur rub or gallop Pulmonary; Clear bilaterally Abdomen; soft, nontender, rounded fullness lower abdomen, nontender ? Bladder. no palpable mass or hepatosplenomegaly, bowel sounds are active Rectal; no external lesion noted, on anoscopy there is a persistent anal fissure on the left, exquisitely tender Skin; benign exam, no jaundice rash or appreciable lesions Extremities; no clubbing cyanosis or edema skin warm and dry Neuro/Psych; alert and oriented x4, grossly nonfocal mood and affect appropriate       Assessment & Plan:   #58  28 year old female with persistent anal fissure/left.  Patient was symptomatic for about 10 months prior to being seen here, and had undergone a laser sphincterolysis in [place] in June 2020 with no improvement in symptoms.  She has now been on medical therapy with diltiazem/lidocaine ointment over the past 5 weeks with no significant improvement in symptoms or exam.  #2 history of iron deficiency anemia/resolved.  We will plan repeat labs in 3 months  Plan; continue diltiazem 2% compounded with lidocaine 5% ointment 4 times daily, refill sent.  Patient was advised that it can take 2 to 3 months for anal fissures to completely heal. Add Senokot 1-2 daily at bedtime Patient advised to use  MiraLAX 17 g in 8 ounces of water daily as needed. High-fiber diet with liberal fluids. Will refer patient to [facility]/Dr. Leighton Ruff for nonhealing anal fissure.  Amy S Esterwood PA-C 08/16/2019   Cc: No ref. provider found

## 2019-08-19 ENCOUNTER — Other Ambulatory Visit: Payer: Self-pay

## 2019-08-19 ENCOUNTER — Inpatient Hospital Stay (HOSPITAL_COMMUNITY)
Admission: AD | Admit: 2019-08-19 | Discharge: 2019-08-19 | Disposition: A | Discharge status: Home / Self Care | Payer: PRIVATE HEALTH INSURANCE | Attending: Obstetrics & Gynecology | Admitting: Obstetrics & Gynecology

## 2019-08-19 DIAGNOSIS — N912 Amenorrhea, unspecified: Secondary | ICD-10-CM | POA: Diagnosis present

## 2019-08-19 NOTE — Discharge Instructions (Signed)
[facility] ° ° °[facility]       Phone: [phone] ° °[facility] Phone: [phone] ° °[facility]  Phone: [phone] ° °[facility]  Phone: [phone] ° °[facility]  Phone: [phone] ° °[facility]       Phone: [phone] ° °[facility]    Phone: [phone]  ° °[facility]    Phone: [phone] ° °[facility]    Phone: [phone] ° °[facility]    Phone: [phone]  ° °[facility]  Phone: [phone] ° °[facility]    Phone: [phone] ° °[facility]    Phone: [phone] ° °[facility]    Phone: [phone] ° °

## 2019-08-19 NOTE — MAU Provider Note (Signed)
First Provider Initiated Contact with Patient 08/19/19 [phone]      S Ms. Diana Hensley is a 28 y.o. female who presents to MAU today without complaint and request for pregnancy test for verification.  O BP 119/67 (BP Location: Right Arm)   Pulse (!) 102   Temp 97.9 F (36.6 C)   Resp 18   Ht 5\' 4"  (1.626 m)   Wt 57.2 kg   BMI 21.63 kg/m  Physical Exam  Vitals reviewed. Constitutional: She is oriented to person, place, and time. She appears well-developed and well-nourished. No distress.  HENT:  Head: Normocephalic and atraumatic.  Eyes: Conjunctivae are normal.  Neck: Normal range of motion.  Cardiovascular: Normal rate.  Respiratory: Effort normal.  Musculoskeletal: Normal range of motion.  Neurological: She is alert and oriented to person, place, and time.  Psychiatric: She has a normal mood and affect. Her behavior is normal.    A Medical screening exam complete Amenorrhea  P Patient informed that pregnancy verification not performed in MAU. Reviewed places to obtain verification including [facility] and [facility] Discharge from MAU in stable condition Warning signs for worsening condition that would warrant emergency follow-up discussed Patient may return to MAU as needed for pregnancy related complaints  Gavin Pound, CNM 08/19/2019 7:11 AM

## 2019-08-19 NOTE — MAU Note (Signed)
I came in for pregnancy test. Had positive test last night. LMP 07/20/2019. Denies any pain or vag bleeding.

## 2019-08-19 NOTE — MAU Note (Signed)
Diana Hensley CNM made aware of pt's Triage assessment. Will see pt in Triage.

## 2019-08-22 ENCOUNTER — Telehealth: Payer: Self-pay | Admitting: Physician Assistant

## 2019-08-22 NOTE — Telephone Encounter (Signed)
Will route to Nicoletta Ba, PA-C to make her aware.

## 2019-08-22 NOTE — Telephone Encounter (Signed)
I called [place] and left her a detailed message about what Judson Roch at [facility] had stated.

## 2019-08-22 NOTE — Telephone Encounter (Signed)
I called and left a message for Judson Roch that had called that it's up to the patient if she wants to go for her appointment or she can call her insurance and find out where they cover and we can try to refer her differently.

## 2019-08-23 NOTE — Telephone Encounter (Signed)
Ok, that is unfortunate - please call pt and have her call her insurance company to find out which surgical practices take her insurance .Marland Kitchen may need to be in another town as no other Astronomer in [place]

## 2019-08-23 NOTE — Telephone Encounter (Signed)
I spoke with both the patient and her husband and they will check with her insurance company and call us back tomorrow.

## 2019-08-25 ENCOUNTER — Other Ambulatory Visit: Payer: Self-pay

## 2019-08-25 ENCOUNTER — Ambulatory Visit (INDEPENDENT_AMBULATORY_CARE_PROVIDER_SITE_OTHER): Payer: Medicaid Other

## 2019-08-25 DIAGNOSIS — Z3200 Encounter for pregnancy test, result unknown: Secondary | ICD-10-CM

## 2019-08-25 DIAGNOSIS — Z3201 Encounter for pregnancy test, result positive: Secondary | ICD-10-CM

## 2019-08-25 LAB — POCT URINE PREGNANCY: Preg Test, Ur: POSITIVE — AB

## 2019-08-25 NOTE — Progress Notes (Signed)
..   Ms. Hukill presents today for UPT. She has no unusual complaints. LMP: 07-19-19    OBJECTIVE: Appears well, in no apparent distress.  OB History    Gravida  1   Para      Term      Preterm      AB      Living  0     SAB      TAB      Ectopic      Multiple      Live Births             Home UPT Result:Positive In-Office UPT result:Positive I have reviewed the patient's medical, obstetrical, social, and family histories, and medications.   ASSESSMENT: Positive pregnancy test  PLAN Prenatal care to be completed at: unsure

## 2019-09-12 NOTE — Telephone Encounter (Signed)
Diana Hensley they never called back but I see in the system that on 08/25/2019 she had a positive pregnancy test so perhaps that is why I never heard back.

## 2019-09-12 NOTE — Telephone Encounter (Signed)
Thanks for letting me know Diana Hensley, obviously would not want her to be seen by surgery at this point with early pregnancy.  Would not pursue further and let them call back if she is having persistent issues.

## 2019-09-27 ENCOUNTER — Ambulatory Visit (INDEPENDENT_AMBULATORY_CARE_PROVIDER_SITE_OTHER): Payer: PRIVATE HEALTH INSURANCE

## 2019-09-27 DIAGNOSIS — Z3400 Encounter for supervision of normal first pregnancy, unspecified trimester: Secondary | ICD-10-CM | POA: Insufficient documentation

## 2019-09-27 MED ORDER — BLOOD PRESSURE KIT DEVI: 1.0000 | 0 refills | Status: AC

## 2019-09-27 NOTE — Progress Notes (Signed)
I connected with  Diana Hensley on 09/27/19 by a video enabled telemedicine application and verified that I am speaking with the correct person using two identifiers.   I discussed the limitations of evaluation and management by telemedicine. The patient expressed understanding and agreed to proceed.   PRENATAL INTAKE SUMMARY  Diana Hensley presents today New OB Nurse Interview.  OB History    Gravida  1   Para      Term      Preterm      AB      Living  0     SAB      TAB      Ectopic      Multiple      Live Births             I have reviewed the patient's medical, obstetrical, social, and family histories, medications, and available lab results.  SUBJECTIVE She has no unusual complaints  OBJECTIVE Initial Physical Exam (New OB)  GENERAL APPEARANCE: alert, well appearing, oriented to person, place and time   ASSESSMENT Normal pregnancy  PLAN PREFERS FEMALE PROVIDERS Prenatal care @ [facility] Ordered NEW OB appt. 10/04/2019

## 2019-09-27 NOTE — Progress Notes (Signed)
Patient seen and assessed by nursing staff during this encounter. I have reviewed the chart and agree with the documentation and plan.  Catalina Antigua, MD 09/27/2019 9:20 AM

## 2019-10-04 ENCOUNTER — Other Ambulatory Visit: Payer: Self-pay

## 2019-10-04 ENCOUNTER — Ambulatory Visit (INDEPENDENT_AMBULATORY_CARE_PROVIDER_SITE_OTHER): Payer: Medicaid Other | Admitting: Advanced Practice Midwife

## 2019-10-04 VITALS — BP 136/82 | HR 98 | Wt 129.0 lb

## 2019-10-04 DIAGNOSIS — Z113 Encounter for screening for infections with a predominantly sexual mode of transmission: Secondary | ICD-10-CM | POA: Diagnosis not present

## 2019-10-04 DIAGNOSIS — Z3401 Encounter for supervision of normal first pregnancy, first trimester: Secondary | ICD-10-CM | POA: Diagnosis not present

## 2019-10-04 DIAGNOSIS — Z124 Encounter for screening for malignant neoplasm of cervix: Secondary | ICD-10-CM | POA: Diagnosis not present

## 2019-10-04 DIAGNOSIS — Z3400 Encounter for supervision of normal first pregnancy, unspecified trimester: Secondary | ICD-10-CM

## 2019-10-04 DIAGNOSIS — Z1151 Encounter for screening for human papillomavirus (HPV): Secondary | ICD-10-CM | POA: Diagnosis not present

## 2019-10-04 DIAGNOSIS — Z3A11 11 weeks gestation of pregnancy: Secondary | ICD-10-CM

## 2019-10-04 DIAGNOSIS — K5900 Constipation, unspecified: Secondary | ICD-10-CM

## 2019-10-04 DIAGNOSIS — N898 Other specified noninflammatory disorders of vagina: Secondary | ICD-10-CM

## 2019-10-04 DIAGNOSIS — O99611 Diseases of the digestive system complicating pregnancy, first trimester: Secondary | ICD-10-CM

## 2019-10-04 MED ORDER — DOCUSATE SODIUM 100 MG PO CAPS: 100.0000 mg | ORAL_CAPSULE | Freq: Two times a day (BID) | ORAL | 2 refills | Status: AC | PRN

## 2019-10-04 NOTE — Patient Instructions (Addendum)
First Trimester of Pregnancy The first trimester of pregnancy is from week 1 until the end of week 13 (months 1 through 3). A week after a sperm fertilizes an egg, the egg will implant on the wall of the uterus. This embryo will begin to develop into a baby. Genes from you and your partner will form the baby. The female genes will determine whether the baby will be a boy or a girl. At 6-8 weeks, the eyes and face will be formed, and the heartbeat can be seen on ultrasound. At the end of 12 weeks, all the baby's organs will be formed. Now that you are pregnant, you will want to do everything you can to have a healthy baby. Two of the most important things are to get good prenatal care and to follow your health care provider's instructions. Prenatal care is all the medical care you receive before the baby's birth. This care will help prevent, find, and treat any problems during the pregnancy and childbirth. Body changes during your first trimester Your body goes through many changes during pregnancy. The changes vary from woman to woman.  You may gain or lose a couple of pounds at first.  You may feel sick to your stomach (nauseous) and you may throw up (vomit). If the vomiting is uncontrollable, call your health care provider.  You may tire easily.  You may develop headaches that can be relieved by medicines. All medicines should be approved by your health care provider.  You may urinate more often. Painful urination may mean you have a bladder infection.  You may develop heartburn as a result of your pregnancy.  You may develop constipation because certain hormones are causing the muscles that push stool through your intestines to slow down.  You may develop hemorrhoids or swollen veins (varicose veins).  Your breasts may begin to grow larger and become tender. Your nipples may stick out more, and the tissue that surrounds them (areola) may become darker.  Your gums may bleed and may be  sensitive to brushing and flossing.  Dark spots or blotches (chloasma, mask of pregnancy) may develop on your face. This will likely fade after the baby is born.  Your menstrual periods will stop.  You may have a loss of appetite.  You may develop cravings for certain kinds of food.  You may have changes in your emotions from day to day, such as being excited to be pregnant or being concerned that something may go wrong with the pregnancy and baby.  You may have more vivid and strange dreams.  You may have changes in your hair. These can include thickening of your hair, rapid growth, and changes in texture. Some women also have hair loss during or after pregnancy, or hair that feels dry or thin. Your hair will most likely return to normal after your baby is born. What to expect at prenatal visits During a routine prenatal visit:  You will be weighed to make sure you and the baby are growing normally.  Your blood pressure will be taken.  Your abdomen will be measured to track your baby's growth.  The fetal heartbeat will be listened to between weeks 10 and 14 of your pregnancy.  Test results from any previous visits will be discussed. Your health care provider may ask you:  How you are feeling.  If you are feeling the baby move.  If you have had any abnormal symptoms, such as leaking fluid, bleeding, severe headaches, or abdominal   cramping.  If you are using any tobacco products, including cigarettes, chewing tobacco, and electronic cigarettes.  If you have any questions. Other tests that may be performed during your first trimester include:  Blood tests to find your blood type and to check for the presence of any previous infections. The tests will also be used to check for low iron levels (anemia) and protein on red blood cells (Rh antibodies). Depending on your risk factors, or if you previously had diabetes during pregnancy, you may have tests to check for high blood sugar  that affects pregnant women (gestational diabetes).  Urine tests to check for infections, diabetes, or protein in the urine.  An ultrasound to confirm the proper growth and development of the baby.  Fetal screens for spinal cord problems (spina bifida) and Down syndrome.  HIV (human immunodeficiency virus) testing. Routine prenatal testing includes screening for HIV, unless you choose not to have this test.  You may need other tests to make sure you and the baby are doing well. Follow these instructions at home: Medicines  Follow your health care provider's instructions regarding medicine use. Specific medicines may be either safe or unsafe to take during pregnancy.  Take a prenatal vitamin that contains at least 600 micrograms (mcg) of folic acid.  If you develop constipation, try taking a stool softener if your health care provider approves. Eating and drinking   Eat a balanced diet that includes fresh fruits and vegetables, whole grains, good sources of protein such as meat, eggs, or tofu, and low-fat dairy. Your health care provider will help you determine the amount of weight gain that is right for you.  Avoid raw meat and uncooked cheese. These carry germs that can cause birth defects in the baby.  Eating four or five small meals rather than three large meals a day may help relieve nausea and vomiting. If you start to feel nauseous, eating a few soda crackers can be helpful. Drinking liquids between meals, instead of during meals, also seems to help ease nausea and vomiting.  Limit foods that are high in fat and processed sugars, such as fried and sweet foods.  To prevent constipation: ? Eat foods that are high in fiber, such as fresh fruits and vegetables, whole grains, and beans. ? Drink enough fluid to keep your urine clear or pale yellow. Activity  Exercise only as directed by your health care provider. Most women can continue their usual exercise routine during  pregnancy. Try to exercise for 30 minutes at least 5 days a week. Exercising will help you: ? Control your weight. ? Stay in shape. ? Be prepared for labor and delivery.  Experiencing pain or cramping in the lower abdomen or lower back is a good sign that you should stop exercising. Check with your health care provider before continuing with normal exercises.  Try to avoid standing for long periods of time. Move your legs often if you must stand in one place for a long time.  Avoid heavy lifting.  Wear low-heeled shoes and practice good posture.  You may continue to have sex unless your health care provider tells you not to. Relieving pain and discomfort  Wear a good support bra to relieve breast tenderness.  Take warm sitz baths to soothe any pain or discomfort caused by hemorrhoids. Use hemorrhoid cream if your health care provider approves.  Rest with your legs elevated if you have leg cramps or low back pain.  If you develop varicose veins in   your legs, wear support hose. Elevate your feet for 15 minutes, 3-4 times a day. Limit salt in your diet. Prenatal care  Schedule your prenatal visits by the twelfth week of pregnancy. They are usually scheduled monthly at first, then more often in the last 2 months before delivery.  Write down your questions. Take them to your prenatal visits.  Keep all your prenatal visits as told by your health care provider. This is important. Safety  Wear your seat belt at all times when driving.  Make a list of emergency phone numbers, including numbers for family, friends, the hospital, and police and fire departments. General instructions  Ask your health care provider for a referral to a local prenatal education class. Begin classes no later than the beginning of month 6 of your pregnancy.  Ask for help if you have counseling or nutritional needs during pregnancy. Your health care provider can offer advice or refer you to specialists for help  with various needs.  Do not use hot tubs, steam rooms, or saunas.  Do not douche or use tampons or scented sanitary pads.  Do not cross your legs for long periods of time.  Avoid cat litter boxes and soil used by cats. These carry germs that can cause birth defects in the baby and possibly loss of the fetus by miscarriage or stillbirth.  Avoid all smoking, herbs, alcohol, and medicines not prescribed by your health care provider. Chemicals in these products affect the formation and growth of the baby.  Do not use any products that contain nicotine or tobacco, such as cigarettes and e-cigarettes. If you need help quitting, ask your health care provider. You may receive counseling support and other resources to help you quit.  Schedule a dentist appointment. At home, brush your teeth with a soft toothbrush and be gentle when you floss. Contact a health care provider if:  You have dizziness.  You have mild pelvic cramps, pelvic pressure, or nagging pain in the abdominal area.  You have persistent nausea, vomiting, or diarrhea.  You have a bad smelling vaginal discharge.  You have pain when you urinate.  You notice increased swelling in your face, hands, legs, or ankles.  You are exposed to fifth disease or chickenpox.  You are exposed to [place] measles (rubella) and have never had it. Get help right away if:  You have a fever.  You are leaking fluid from your vagina.  You have spotting or bleeding from your vagina.  You have severe abdominal cramping or pain.  You have rapid weight gain or loss.  You vomit blood or material that looks like coffee grounds.  You develop a severe headache.  You have shortness of breath.  You have any kind of trauma, such as from a fall or a car accident. Summary  The first trimester of pregnancy is from week 1 until the end of week 13 (months 1 through 3).  Your body goes through many changes during pregnancy. The changes vary from  woman to woman.  You will have routine prenatal visits. During those visits, your health care provider will examine you, discuss any test results you may have, and talk with you about how you are feeling. This information is not intended to replace advice given to you by your health care provider. Make sure you discuss any questions you have with your health care provider. Document Revised: 08/14/2017 Document Reviewed: 08/13/2016 Elsevier Patient Education  [place].    Constipation, Adult Constipation  is when a person has fewer bowel movements in a week than normal, has difficulty having a bowel movement, or has stools that are dry, hard, or larger than normal. Constipation may be caused by an underlying condition. It may become worse with age if a person takes certain medicines and does not take in enough fluids. Follow these instructions at home: Eating and drinking   Eat foods that have a lot of fiber, such as fresh fruits and vegetables, whole grains, and beans.  Limit foods that are high in fat, low in fiber, or overly processed, such as french fries, hamburgers, cookies, candies, and soda.  Drink enough fluid to keep your urine clear or pale yellow. General instructions  Exercise regularly or as told by your health care provider.  Go to the restroom when you have the urge to go. Do not hold it in.  Take over-the-counter and prescription medicines only as told by your health care provider. These include any fiber supplements.  Practice pelvic floor retraining exercises, such as deep breathing while relaxing the lower abdomen and pelvic floor relaxation during bowel movements.  Watch your condition for any changes.  Keep all follow-up visits as told by your health care provider. This is important. Contact a health care provider if:  You have pain that gets worse.  You have a fever.  You do not have a bowel movement after 4 days.  You vomit.  You are not  hungry.  You lose weight.  You are bleeding from the anus.  You have thin, pencil-like stools. Get help right away if:  You have a fever and your symptoms suddenly get worse.  You leak stool or have blood in your stool.  Your abdomen is bloated.  You have severe pain in your abdomen.  You feel dizzy or you faint. This information is not intended to replace advice given to you by your health care provider. Make sure you discuss any questions you have with your health care provider. Document Revised: 08/14/2017 Document Reviewed: 02/20/2016 Elsevier Patient Education  2020 [employer].

## 2019-10-04 NOTE — Progress Notes (Signed)
Subjective:   Diana Hensley is a 29 y.o. G1P0 at 58w0dby LMP being seen today for her first obstetrical visit.  Her obstetrical history is significant for none, G1 and has Sepsis secondary to UTI ([place]; Microcytic anemia; Hyponatremia; Sepsis due to gram-negative UTI ([facility]; and Supervision of normal first pregnancy on their problem list.. Patient does intend to breast feed. Pregnancy history fully reviewed.  Patient reports no complaints.  HISTORY: OB History  Gravida Para Term Preterm AB Living  1 0 0 0 0 0  SAB TAB Ectopic Multiple Live Births  0 0 0 0 0    # Outcome Date GA Lbr Len/2nd Weight Sex Delivery Anes PTL Lv  1 Current            Past Medical History:  Diagnosis Date  . Anal fissure   . Anemia    Past Surgical History:  Procedure Laterality Date  . ANAL FISSURE REPAIR  02/21/2019   [place]  Family History  Problem Relation Age of Onset  . Healthy Mother   . Healthy Father    Social History   Tobacco Use  . Smoking status: Never Smoker  . Smokeless tobacco: Never Used  Substance Use Topics  . Alcohol use: Never  . Drug use: Never   No Known Allergies Current Outpatient Medications on File Prior to Visit  Medication Sig Dispense Refill  . AMBULATORY NON FORMULARY MEDICATION Medication Name: Diltiazem ointment 2%/ with Lidocaine 5%   Apply per rectum 3- 4 times daily. (Patient not taking: Reported on 08/25/2019) 30 g 3  . Blood Pressure Monitoring (BLOOD PRESSURE KIT) DEVI 1 kit by Does not apply route once a week. Check Blood Pressure regularly and record readings into the Babyscripts App.  Large Cuff.  DX O90.0 1 each 0  . ferrous sulfate 325 (65 FE) MG tablet Take 1 tablet (325 mg total) by mouth 3 (three) times daily with meals. (Patient taking differently: Take 325 mg by mouth daily with breakfast. ) 90 tablet 0  . lidocaine (XYLOCAINE) 5 % ointment APPLY RECTALLY 4 TIMES A DAY FOR 6-8 WEEKS. 30 g 0  . Probiotic Product (PROBIOTIC  DAILY PO) Take 1 tablet by mouth daily.     No current facility-administered medications on file prior to visit.     Indications for ASA therapy (per uptodate) One of the following: Previous pregnancy with preeclampsia, especially early onset and with an adverse outcome No Multifetal gestation No Chronic hypertension No Type 1 or 2 diabetes mellitus No Chronic kidney disease No Autoimmune disease (antiphospholipid syndrome, systemic lupus erythematosus) No   Two or more of the following: Nulliparity Yes Obesity (body mass index >30 kg/m2) No Family history of preeclampsia in mother or sister No Age ?35 years No Sociodemographic characteristics (African American race, low socioeconomic level) No Personal risk factors (eg, previous pregnancy with low birth weight or small for gestational age infant, previous adverse pregnancy outcome [eg, stillbirth], interval >10 years between pregnancies) No  Indications for early 1 hour GTT (per uptodate)  BMI >25 (>23 in Asian women): No  AND one of the following  Exam   Vitals:   10/04/19 0944  BP: 136/82  Pulse: 98  Weight: 129 lb (58.5 kg)      Uterus:     Pelvic Exam: Perineum: no hemorrhoids, normal perineum   Vulva: normal external genitalia, no lesions   Vagina:  normal mucosa, normal discharge   Cervix: no lesions  and normal, pap smear done.    Adnexa: normal adnexa and no mass, fullness, tenderness   Bony Pelvis: average  System: General: well-developed, well-nourished female in no acute distress   Breast:  normal appearance, no masses or tenderness   Skin: normal coloration and turgor, no rashes   Neurologic: oriented, normal, negative, normal mood   Extremities: normal strength, tone, and muscle mass, ROM of all joints is normal   HEENT PERRLA, extraocular movement intact and sclera clear, anicteric   Mouth/Teeth mucous membranes moist, pharynx normal without lesions and dental hygiene good   Neck supple and no masses    Cardiovascular: regular rate and rhythm   Respiratory:  no respiratory distress, normal breath sounds   Abdomen: soft, non-tender; bowel sounds normal; no masses,  no organomegaly     Assessment:   Pregnancy: G1P0 Patient Active Problem List   Diagnosis Date Noted  . Supervision of normal first pregnancy 09/27/2019  . Sepsis due to gram-negative UTI ([place]) 04/01/2019  . Sepsis secondary to UTI ([place]) 03/31/2019  . Microcytic anemia 03/31/2019  . Hyponatremia 03/31/2019     Plan:   1. Supervision of normal first pregnancy, antepartum --Anticipatory guidance about next visits/weeks of pregnancy given. - Obstetric Panel, Including HIV - Culture, OB Urine - Genetic Screening - Cytology - PAP( [place]) - Cervicovaginal ancillary only( [place]) - Enroll Patient in Babyscripts - Babyscripts Schedule Optimization  2. Constipation during pregnancy in first trimester --Discussed dietary changes, pt Ok to use Miralax PRN.  Will add stool softener. - docusate sodium (COLACE) 100 MG capsule; Take 1 capsule (100 mg total) by mouth 2 (two) times daily as needed.  Dispense: 30 capsule; Refill: 2  Initial labs drawn. Continue prenatal vitamins. Discussed and offered genetic screening options, including Quad screen/AFP, NIPS testing, and option to decline testing. Benefits/risks/alternatives reviewed. Pt aware that anatomy US is form of genetic screening with lower accuracy in detecting trisomies than blood work.  Pt chooses genetic screening today. NIPS: ordered. Ultrasound discussed; fetal anatomic survey: requested. Problem list reviewed and updated. The nature of [place] with multiple MDs and other Advanced Practice Providers was explained to patient; also emphasized that residents, students are part of our team. Routine obstetric precautions reviewed. No follow-ups on file.   Fatima Blank, CNM 10/04/19 10:00 AM

## 2019-10-04 NOTE — Progress Notes (Signed)
New OB.  Reports no problems today.

## 2019-10-05 LAB — OBSTETRIC PANEL, INCLUDING HIV
Antibody Screen: NEGATIVE
Basophils Absolute: 0 10*3/uL (ref 0.0–0.2)
Basos: 1 %
EOS (ABSOLUTE): 0.1 10*3/uL (ref 0.0–0.4)
Eos: 2 %
HIV Screen 4th Generation wRfx: NONREACTIVE
Hematocrit: 39.4 % (ref 34.0–46.6)
Hemoglobin: 13.4 g/dL (ref 11.1–15.9)
Hepatitis B Surface Ag: NEGATIVE
Immature Grans (Abs): 0 10*3/uL (ref 0.0–0.1)
Immature Granulocytes: 0 %
Lymphocytes Absolute: 2.3 10*3/uL (ref 0.7–3.1)
Lymphs: 26 %
MCH: 29.1 pg (ref 26.6–33.0)
MCHC: 34 g/dL (ref 31.5–35.7)
MCV: 86 fL (ref 79–97)
Monocytes Absolute: 0.7 10*3/uL (ref 0.1–0.9)
Monocytes: 8 %
Neutrophils Absolute: 5.5 10*3/uL (ref 1.4–7.0)
Neutrophils: 63 %
Platelets: 267 10*3/uL (ref 150–450)
RBC: 4.61 x10E6/uL (ref 3.77–5.28)
RDW: 13.9 % (ref 11.7–15.4)
RPR Ser Ql: NONREACTIVE
Rh Factor: POSITIVE
Rubella Antibodies, IGG: 1.53 index (ref >0.99)
WBC: 8.6 10*3/uL (ref 3.4–10.8)

## 2019-10-05 LAB — CERVICOVAGINAL ANCILLARY ONLY
Bacterial Vaginitis (gardnerella): NEGATIVE
Candida Glabrata: NEGATIVE
Candida Vaginitis: NEGATIVE
Chlamydia: NEGATIVE
Comment: NEGATIVE
Comment: NEGATIVE
Comment: NEGATIVE
Comment: NEGATIVE
Comment: NEGATIVE
Comment: NORMAL
Neisseria Gonorrhea: NEGATIVE
Trichomonas: NEGATIVE

## 2019-10-05 LAB — CYTOLOGY - PAP
Comment: NEGATIVE
Diagnosis: NEGATIVE
High risk HPV: NEGATIVE

## 2019-10-07 LAB — URINE CULTURE, OB REFLEX

## 2019-10-07 LAB — CULTURE, OB URINE

## 2019-10-10 ENCOUNTER — Other Ambulatory Visit: Payer: Self-pay | Admitting: Advanced Practice Midwife

## 2019-10-10 ENCOUNTER — Encounter: Payer: Self-pay | Admitting: Advanced Practice Midwife

## 2019-10-10 DIAGNOSIS — O2341 Unspecified infection of urinary tract in pregnancy, first trimester: Secondary | ICD-10-CM

## 2019-10-10 MED ORDER — CEPHALEXIN 500 MG PO CAPS: 500.0000 mg | ORAL_CAPSULE | Freq: Four times a day (QID) | ORAL | 0 refills | Status: AC

## 2019-10-18 ENCOUNTER — Other Ambulatory Visit: Payer: Self-pay

## 2019-10-18 DIAGNOSIS — D509 Iron deficiency anemia, unspecified: Secondary | ICD-10-CM

## 2019-11-01 ENCOUNTER — Other Ambulatory Visit: Payer: Self-pay

## 2019-11-01 ENCOUNTER — Ambulatory Visit (INDEPENDENT_AMBULATORY_CARE_PROVIDER_SITE_OTHER): Payer: Medicaid Other | Admitting: Advanced Practice Midwife

## 2019-11-01 VITALS — BP 122/77 | HR 108 | Wt 135.4 lb

## 2019-11-01 DIAGNOSIS — Z3402 Encounter for supervision of normal first pregnancy, second trimester: Secondary | ICD-10-CM | POA: Diagnosis not present

## 2019-11-01 DIAGNOSIS — Z3A19 19 weeks gestation of pregnancy: Secondary | ICD-10-CM

## 2019-11-01 DIAGNOSIS — O2341 Unspecified infection of urinary tract in pregnancy, first trimester: Secondary | ICD-10-CM

## 2019-11-01 NOTE — Progress Notes (Signed)
   PRENATAL VISIT NOTE  Subjective:  Diana Hensley is a 29 y.o. G1P0 at [redacted]w[redacted]d being seen today for ongoing prenatal care.  She is currently monitored for the following issues for this low-risk pregnancy and has Sepsis secondary to UTI [facility]); Microcytic anemia; Hyponatremia; Sepsis due to gram-negative UTI [facility]); and Supervision of normal first pregnancy on their problem list.  Patient reports no complaints.  Contractions: Not present. Vag. Bleeding: None.   . Denies leaking of fluid.   The following portions of the patient's history were reviewed and updated as appropriate: allergies, current medications, past family history, past medical history, past social history, past surgical history and problem list.   Objective:   Vitals:   11/01/19 1012  BP: 122/77  Pulse: (!) 108  Weight: 135 lb 6.4 oz (61.4 kg)    Fetal Status: Fetal Heart Rate (bpm): 148         General:  Alert, oriented and cooperative. Patient is in no acute distress.  Skin: Skin is warm and dry. No rash noted.   Cardiovascular: Normal heart rate noted  Respiratory: Normal respiratory effort, no problems with respiration noted  Abdomen: Soft, gravid, appropriate for gestational age.  Pain/Pressure: Absent     Pelvic: Cervical exam deferred        Extremities: Normal range of motion.  Edema: None  Mental Status: Normal mood and affect. Normal behavior. Normal judgment and thought content.   Assessment and Plan:  Pregnancy: G1P0 at [redacted]w[redacted]d  1. Encounter for supervision of normal first pregnancy in second trimester --Pt denies cramping, LOF, or vaginal bleeding --Next visit virtual in 4 weeks --Anatomy US in 4 weeks - AFP, Serum, Open Spina Bifida - [place] MFM OB COMP + 14 WK; Future  2. Urinary tract infection in mother during first trimester of pregnancy --TOC today, pt asymptomatic - Culture, OB Urine   Preterm labor symptoms and general obstetric precautions including but not limited to vaginal bleeding,  contractions, leaking of fluid and fetal movement were reviewed in detail with the patient. Please refer to After Visit Summary for other counseling recommendations.   Return in about 4 weeks (around 11/29/2019).  Future Appointments  Date Time Provider Department Center  11/29/2019 10:30 AM Brock Bad, MD [facility] None  11/30/2019  8:45 AM [facility] [place] 2 [facility]    Sharen Counter, CNM

## 2019-11-01 NOTE — Progress Notes (Signed)
Pt is here for ROB. [redacted]w[redacted]d.  

## 2019-11-01 NOTE — Patient Instructions (Signed)

## 2019-11-03 LAB — AFP, SERUM, OPEN SPINA BIFIDA
AFP MoM: 1.15
AFP Value: 36.9 ng/mL
Gest. Age on Collection Date: 15 weeks
Maternal Age At EDD: 27.1 yr
OSBR Risk 1 IN: 7603
Test Results:: NEGATIVE
Weight: 129 [lb_av]

## 2019-11-03 LAB — URINE CULTURE, OB REFLEX

## 2019-11-03 LAB — CULTURE, OB URINE

## 2019-11-29 ENCOUNTER — Telehealth (INDEPENDENT_AMBULATORY_CARE_PROVIDER_SITE_OTHER): Payer: Medicaid Other | Admitting: Advanced Practice Midwife

## 2019-11-29 ENCOUNTER — Encounter: Payer: Self-pay | Admitting: Advanced Practice Midwife

## 2019-11-29 ENCOUNTER — Telehealth: Payer: Self-pay | Admitting: Obstetrics

## 2019-11-29 VITALS — BP 115/74 | HR 90

## 2019-11-29 DIAGNOSIS — O26899 Other specified pregnancy related conditions, unspecified trimester: Secondary | ICD-10-CM

## 2019-11-29 DIAGNOSIS — R109 Unspecified abdominal pain: Secondary | ICD-10-CM

## 2019-11-29 DIAGNOSIS — Z3402 Encounter for supervision of normal first pregnancy, second trimester: Secondary | ICD-10-CM | POA: Diagnosis not present

## 2019-11-29 DIAGNOSIS — Z3A19 19 weeks gestation of pregnancy: Secondary | ICD-10-CM

## 2019-11-29 DIAGNOSIS — O26892 Other specified pregnancy related conditions, second trimester: Secondary | ICD-10-CM

## 2019-11-29 DIAGNOSIS — R102 Pelvic and perineal pain: Secondary | ICD-10-CM

## 2019-11-29 NOTE — Progress Notes (Signed)
TELEHEALTH OBSTETRICS PRENATAL VIRTUAL VIDEO VISIT ENCOUNTER NOTE  Provider location: [facility] [place]   I connected with Diana Hensley on 11/29/19 at 10:15 AM EDT by MyChart Video Encounter at home and verified that I am speaking with the correct person using two identifiers.   I discussed the limitations, risks, security and privacy concerns of performing an evaluation and management service virtually and the availability of in person appointments. I also discussed with the patient that there may be a patient responsible charge related to this service. The patient expressed understanding and agreed to proceed. Subjective:  Diana Hensley is a 29 y.o. G1P0 at [redacted]w[redacted]d being seen today for ongoing prenatal care.  She is currently monitored for the following issues for this low-risk pregnancy and has Sepsis secondary to UTI [facility]); Microcytic anemia; Hyponatremia; Sepsis due to gram-negative UTI (HCC); and Supervision of normal first pregnancy on their problem list.  Patient reports sharp pain in RLQ of abdomen occasionally with movement.  Contractions: Not present. Vag. Bleeding: None.   . Denies any leaking of fluid.   The following portions of the patient's history were reviewed and updated as appropriate: allergies, current medications, past family history, past medical history, past social history, past surgical history and problem list.   Objective:   Vitals:   11/29/19 1001  BP: 115/74  Pulse: 90    Fetal Status:           General:  Alert, oriented and cooperative. Patient is in no acute distress.  Respiratory: Normal respiratory effort, no problems with respiration noted  Mental Status: Normal mood and affect. Normal behavior. Normal judgment and thought content.  Rest of physical exam deferred due to type of encounter  Imaging: No results found.  Assessment and Plan:  Pregnancy: G1P0 at [redacted]w[redacted]d 1. Encounter for supervision of normal first pregnancy in  second trimester --Pt not sure if she is feeling movement, maybe some flutters, denies cramping, LOF, or vaginal bleeding --Anticipatory guidance about next visits/weeks of pregnancy given. --Pt husband has new job in [place] so pt is moving at the end of this month   2. Pain of round ligament affecting pregnancy, antepartum --Pt with sharp RLQ pain with movement, changing positions in bed. --Likely round ligament pain but precautions reviewed, reasons to seek care/go to MAU including onset of fever, chills, or n/v, or worsening pain.  3. Abdominal pain during pregnancy, second trimester --Hx UTI sepsis prior to pregnancy so will recheck urine due to abdominal pain. Pt to come to office for lab only visit and leave urine sample. - Culture, OB Urine; Future - POC Urinalysis Dipstick OB  Preterm labor symptoms and general obstetric precautions including but not limited to vaginal bleeding, contractions, leaking of fluid and fetal movement were reviewed in detail with the patient. I discussed the assessment and treatment plan with the patient. The patient was provided an opportunity to ask questions and all were answered. The patient agreed with the plan and demonstrated an understanding of the instructions. The patient was advised to call back or seek an in-person office evaluation/go to MAU at [facility] for any urgent or concerning symptoms. Please refer to After Visit Summary for other counseling recommendations.   I provided 15 minutes of face-to-face time during this encounter.  No follow-ups on file.  Future Appointments  Date Time Provider Department Center  11/30/2019  8:45 AM WH-MFC [place] 2 WH-MFCUS MFC-US    Sharen Counter, CNM [facility]  [facility], [place]

## 2019-11-29 NOTE — Patient Instructions (Signed)
Pregnancy and Travel  Most pregnant women can safely travel until the last month of their pregnancy. Your doctor may recommend limiting or avoiding travel depending on how far you are in the pregnancy, and if you have any medical or pregnancy problems. General travel tips Before you go:  Discuss your trip with your doctor. Get examined shortly before you go.  Get a copy of your medical records. Take it with you.  Try to get names of doctors and hospitals in the area where you will be visiting.  Pack your pillow.  Pack any approved medicines and supplements.  Get enough sleep the night before the trip. During your trip:  Ask for locations of doctors and hospitals.  Wear flat, comfortable shoes.  Wear loose-fitting, comfortable clothes.  Wear compression stockings as told by your doctor. They may prevent blood clots that arise from sitting for a long time.  Do leg exercises as told by your doctor.  Eat a balanced diet, drink lots of fluid, and take your vitamins and supplements.  Take water, crackers, and fruit with you.  Take breaks to use the restroom and walk every 2 hours or during stops.  Do not wear yourself out.  Do not ride on a motorcycle.  Rest. If your trip is long, lie down for 30 or more minutes with your feet slightly raised after you reach your destination.  Always wear a seat belt. Tips for traveling to a foreign country Before you go:  Ask your doctor if there are medicines that are safe for you to take if you get diarrhea, constipation, nausea, or vomiting.  Check with your health insurance provider about medical coverage abroad. Purchase travel [employer], if needed.  Make sure you are up to date on vaccines. During your trip:  Do not eat uncooked foods.  Do not eat food from buffets or food that is cold or sitting at room temperature.  Drink bottled beverages and water. Do not use ice.  Wash fruits and vegetables with clean water. If  possible, peel them before eating.  Do not drink unpasteurized milk.  Wear insect repellent if there are mosquitoes or other biting insects. Ask your doctor which repellents are safe. What do I need to know about traveling by car?  Wear your seat belt properly. The belt should be buckled below your abdomen, on your hip bones. The shoulder belt should be off to the side of your abdomen and across the center of your chest.  If you are in the front seat, sit as far away from the dashboard as possible to avoid getting hit hard if the airbag deploys in an accident.  Do not travel for more than 5-6 hours a day. What do I need to know about traveling by bus?  Before making a reservation, ask whether your bus will have a restroom.  Move your arms and legs when seated.  If you have to use the restroom, hold on to the seats and handrails as you walk.  Do not travel for more than 5-6 hours a day. What do I need to know about traveling by train?  Before making a reservation, ask if your train will have a sleeping car and more than one restroom.  If you need to walk while the train is moving, hold on to seats and handrails.  Move your arms and legs when seated.  Do not travel for more than 5-6 hours a day. What do I need to know about traveling  by airplane?  Before booking your trip, ask about the airline's rules about pregnancy. Pregnant women may be restricted from flying after a certain time of the pregnancy. Every airline has its own rules.  Make sure you complete your trip before 36 weeks of pregnancy.  Ask whether the airplane cabin will be pressurized. Do not board an unpressurized plane that will fly above 7,000 ft (2,100 m).  Try to get a bulkhead or an aisle seat so it is easier to get up, stretch, and use the bathroom.  Wear layers since the cabin temperature can change.  Put all your medicines and medical records in your carry-on bag.  Avoid drinking caffeinated or  carbonated beverages.  Avoid eating foods that may make you bloated.  Do not eat a big meal.  If you need to walk through the airplane, hold on to the seats and handrails.  Move your arms and legs when seated.  Wear your seat belt. What do I need to know about traveling by cruise ship?  Before booking your trip, ask the cruise ship company: ? Are pregnant women allowed on the ship? ? Is there a medical facility and doctor on board? ? Does the ship dock in places where there are doctors and medical facilities?  Before booking your trip, ask your doctor: ? Is it safe to take medicines if I get seasick? ? Is it safe to wear acupressure wristbands to prevent seasickness? If the answer is yes, consider buying one. Contact a health care provider if:  You have diarrhea.  You vomit.  You have nausea or seasickness. Get help right away if:  You have vaginal bleeding.  You have severe vomiting or diarrhea.  You have pelvic or abdominal pain.  You have contractions.  Your water breaks.  You have a persistent headache.  Your eyesight changes or you see spots.  Your face or hands are swollen.  You have pain, warmth, or swelling in your legs or ankles. Summary  Most pregnant women can safely travel until the last month of their pregnancy. Your doctor may tell you to limit or avoid travel depending on how far you are in your pregnancy and if you have any medical or pregnancy problems.  The best time to travel is between 14 and 28 weeks of your pregnancy.  Before you go on your trip, make sure you discuss your trip with your health care provider, get a copy of your medical records, and try to get information on medical centers and doctors at your destination.  While on your trip, make sure you wear comfortable clothes and shoes, eat a healthy diet, drink plenty of fluids, take your vitamins and supplements, take breaks and rest often, and wear your seat belt.  Before booking  a flight, ask about the airline's rules about pregnancy. Every airline has its own rules. Do the same for a train, bus, or cruise ship. This information is not intended to replace advice given to you by your health care provider. Make sure you discuss any questions you have with your health care provider. Document Revised: 02/15/2019 Document Reviewed: 10/07/2016 Elsevier Patient Education  2020 [employer].

## 2019-11-29 NOTE — Progress Notes (Signed)
Virtual Visit via Telephone Note  I connected with Diana Hensley on 11/29/19 at 10:15 AM EDT by telephone and verified that I am speaking with the correct person using two identifiers.  No complaints today per pt  Negative AFP Anatomy scheduled tomorrow

## 2019-11-30 ENCOUNTER — Other Ambulatory Visit: Payer: Self-pay

## 2019-11-30 ENCOUNTER — Ambulatory Visit (HOSPITAL_COMMUNITY)
Admission: RE | Admit: 2019-11-30 | Discharge: 2019-11-30 | Disposition: A | Discharge status: Home / Self Care | Payer: Medicaid Other | Source: Ambulatory Visit | Attending: Obstetrics and Gynecology | Admitting: Obstetrics and Gynecology

## 2019-11-30 DIAGNOSIS — Z363 Encounter for antenatal screening for malformations: Secondary | ICD-10-CM

## 2019-11-30 DIAGNOSIS — D649 Anemia, unspecified: Secondary | ICD-10-CM | POA: Diagnosis not present

## 2019-11-30 DIAGNOSIS — O99012 Anemia complicating pregnancy, second trimester: Secondary | ICD-10-CM

## 2019-11-30 DIAGNOSIS — Z3A19 19 weeks gestation of pregnancy: Secondary | ICD-10-CM | POA: Diagnosis not present

## 2019-11-30 DIAGNOSIS — Z3402 Encounter for supervision of normal first pregnancy, second trimester: Secondary | ICD-10-CM | POA: Diagnosis present

## 2019-12-01 ENCOUNTER — Other Ambulatory Visit: Payer: Medicaid Other

## 2019-12-01 DIAGNOSIS — O26892 Other specified pregnancy related conditions, second trimester: Secondary | ICD-10-CM

## 2019-12-04 LAB — CULTURE, OB URINE

## 2019-12-04 LAB — URINE CULTURE, OB REFLEX

## 2019-12-04 MED ORDER — CEFADROXIL 500 MG PO CAPS: 500.0000 mg | ORAL_CAPSULE | Freq: Two times a day (BID) | ORAL | 0 refills | Status: AC

## 2019-12-04 NOTE — Addendum Note (Signed)
Addended by: Sharen Counter A on: 12/04/2019 10:20 AM   Modules accepted: Orders

## 2020-01-02 NOTE — Progress Notes (Signed)
Patient ID: Diana Hensley, female   DOB: [birth year], 29 y.o.   MRN: [id number] Patient seen and assessed by nursing staff during this encounter. I have reviewed the chart and agree with the documentation and plan. I have also made any necessary editorial changes.  Scheryl Darter, MD 01/02/2020 3:31 PM

## 2021-06-30 IMAGING — US US MFM OB COMP +14 WKS
1 series · 14 of 28 positions shown · non-contrast
Comparison: none

[Series 1: us mfm ob comp +14 wks · 103 acquisitions, 14 frames shown]
[im 4/103]
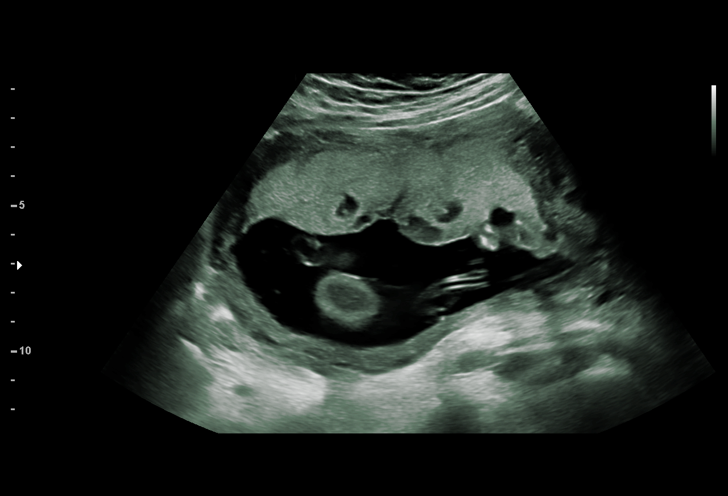
[im 12/103]
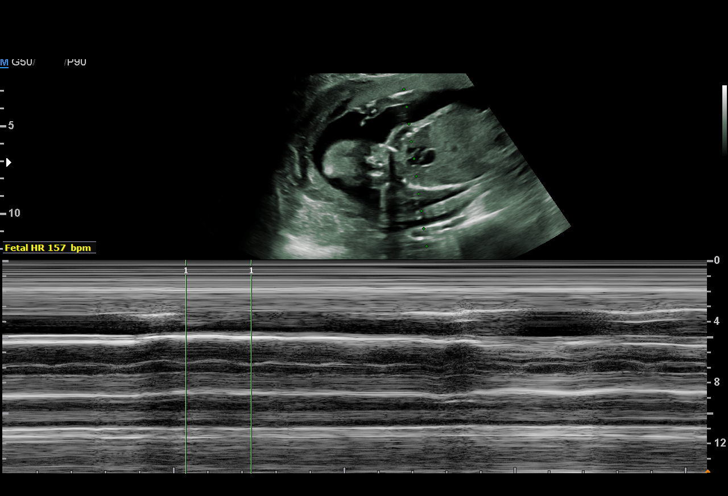
[im 19/103]
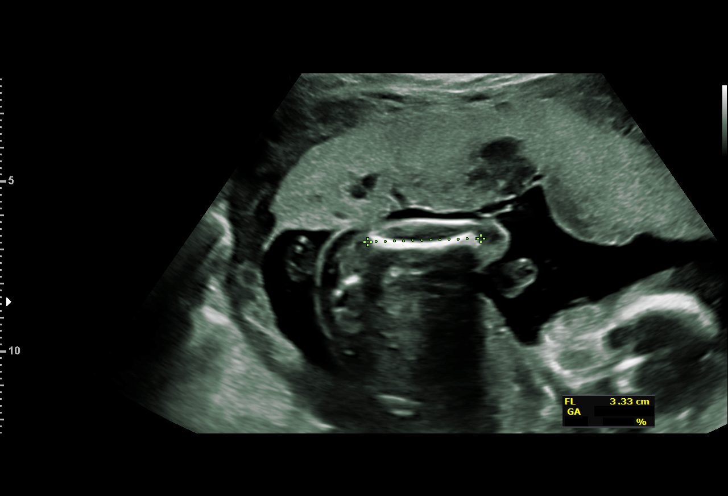
[im 27/103]
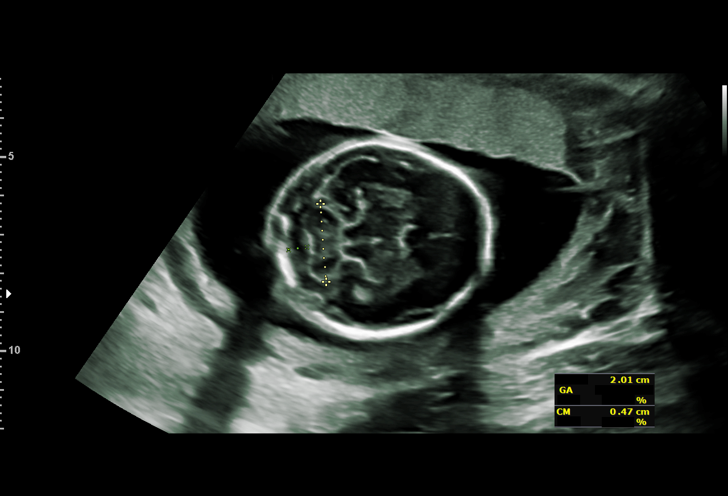
[im 35/103]
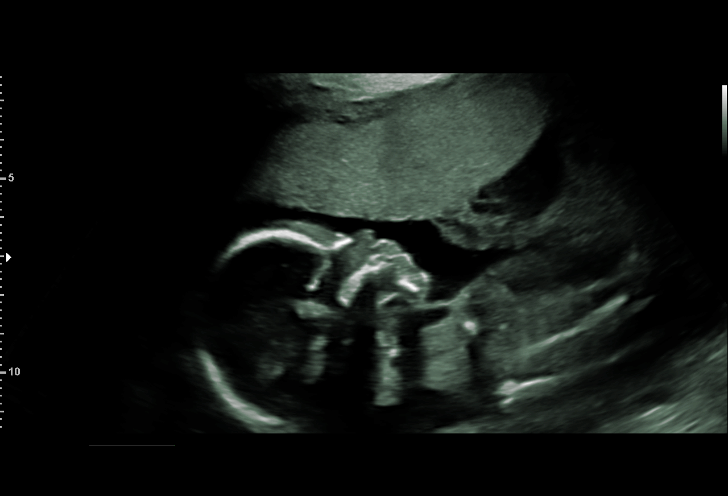
[im 42/103]
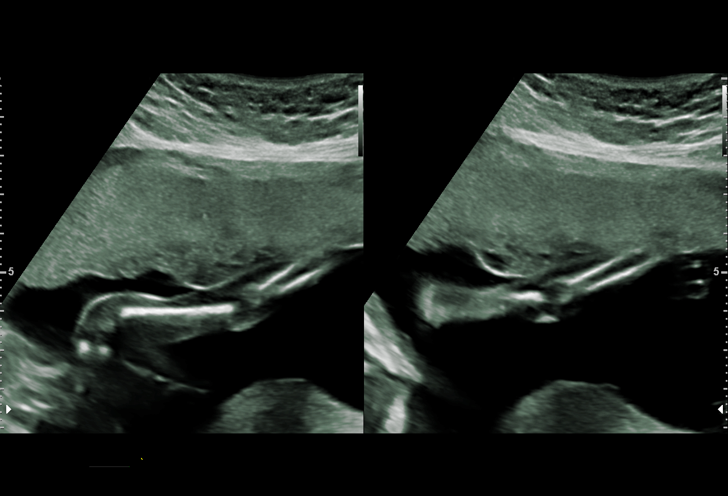
[im 50/103]
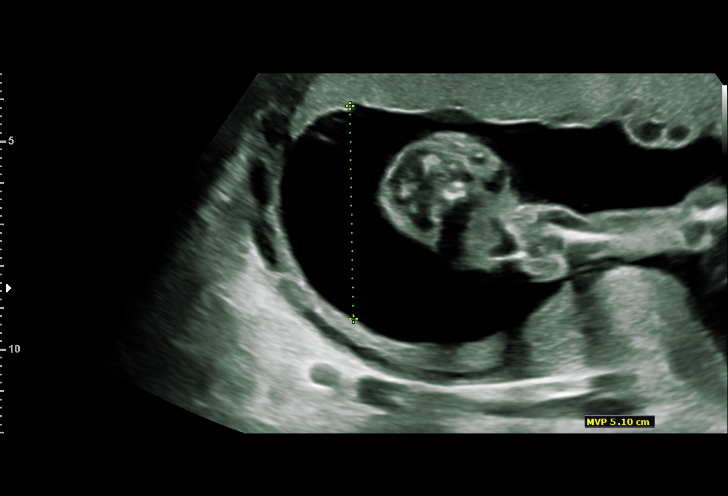
[im 57/103]
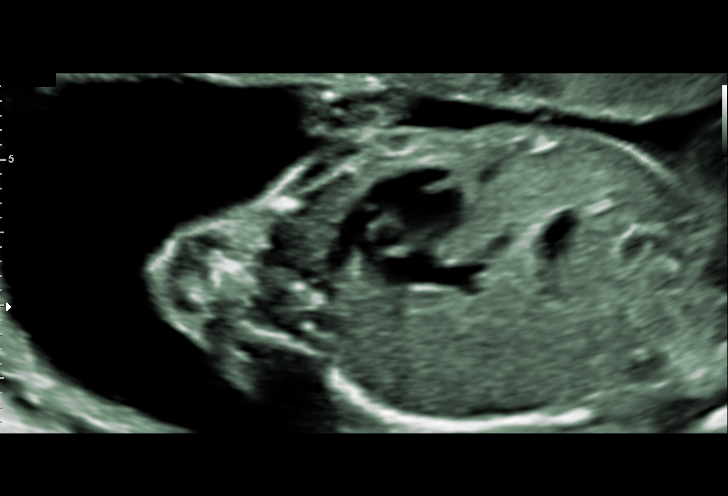
[im 65/103]
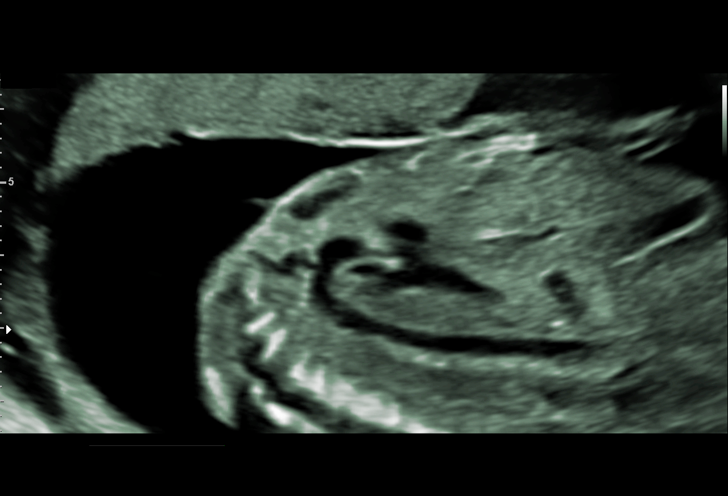
[im 72/103]
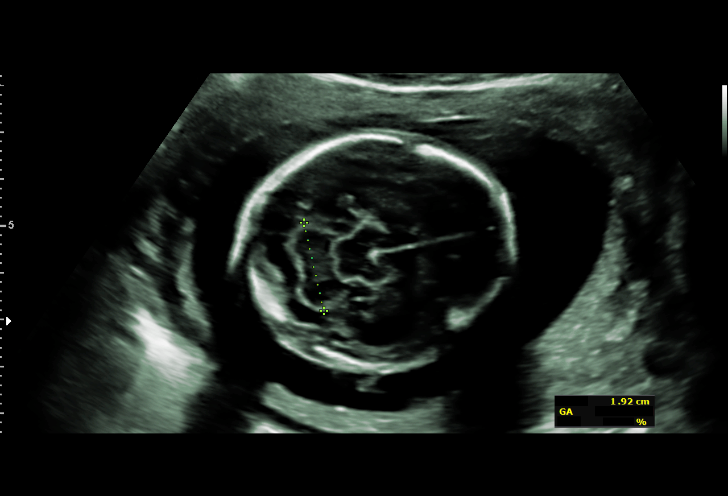
[im 80/103]
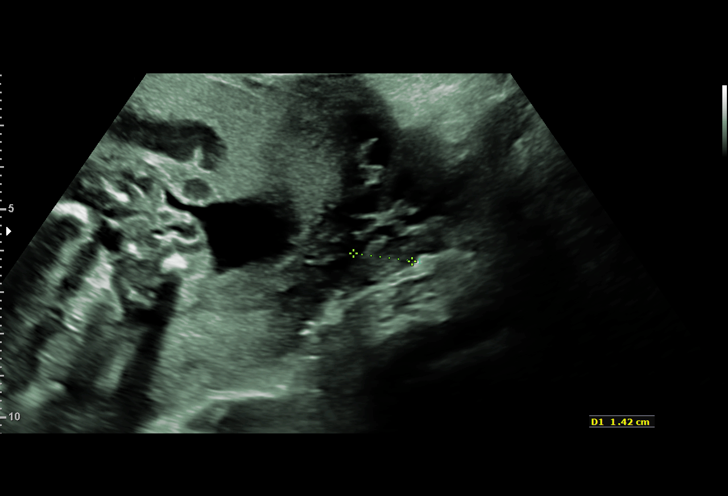
[im 87/103]
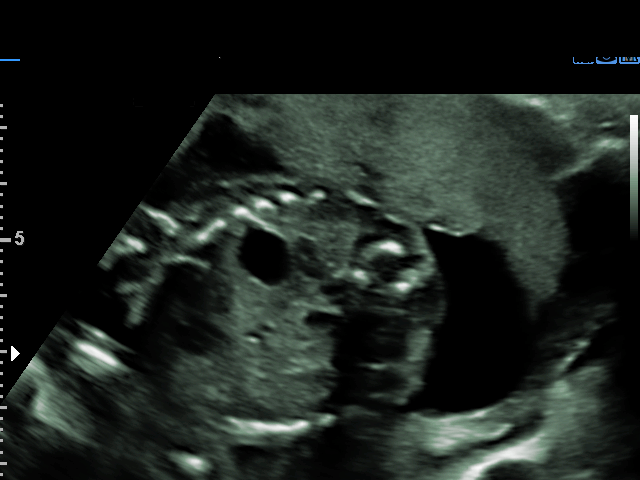
[im 95/103]
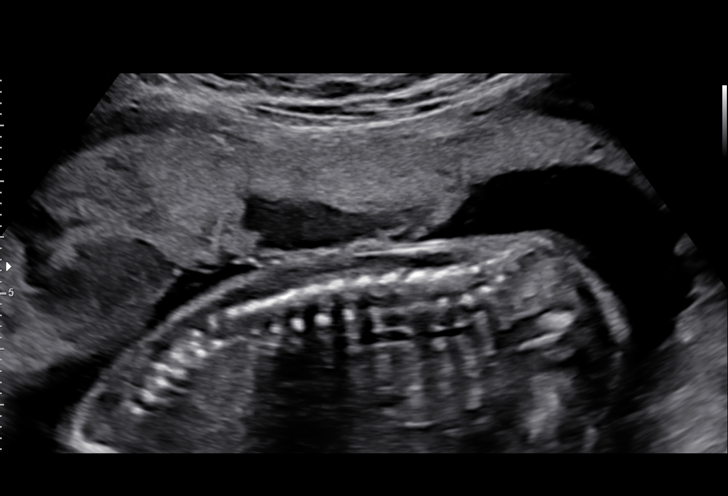
[im 103/103]
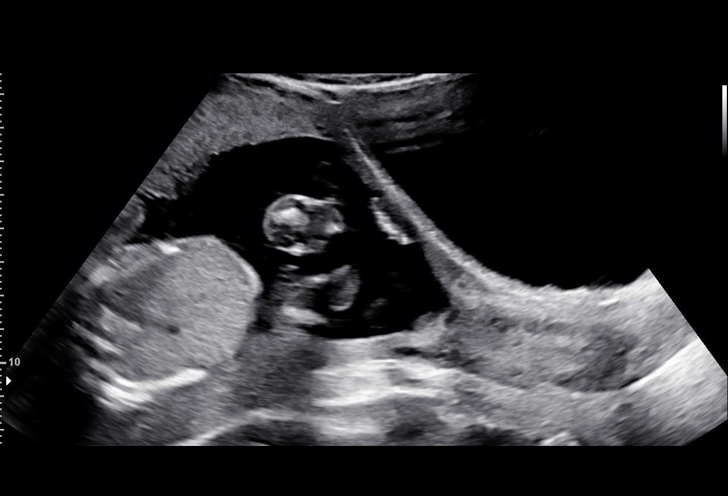

[14 of 28 positions shown; findings below may reference images not displayed]

[REDACTED]
                   [id number]

 ----------------------------------------------------------------------

 ----------------------------------------------------------------------
Indications

  Encounter for antenatal screening for
  malformations
  19 weeks gestation of pregnancy
  Anemia during pregnancy in second
  trimester MICROCYTIC
  NEG Panorama, Horizon, MSAFP
 ----------------------------------------------------------------------
Fetal Evaluation

 Num Of Fetuses:          1
 Fetal Heart Rate(bpm):   157
 Cardiac Activity:        Observed
 Presentation:            Variable
 Placenta:                Anterior
 P. Cord Insertion:       Visualized, central

 Amniotic Fluid
 AFI FV:      Within normal limits

                             Largest Pocket(cm)

Biometry

 BPD:      48.8  mm     G. Age:  20w 5d         96  %    CI:            79  %    70 - 86
                                                         FL/HC:       18.9  %    16.1 -
 HC:      173.6  mm     G. Age:  19w 6d         78  %    HC/AC:       1.15       1.09 -
 AC:      151.2  mm     G. Age:  20w 2d         83  %    FL/BPD:      67.2  %
 FL:       32.8  mm     G. Age:  20w 2d         81  %    FL/AC:       21.7  %    20 - 24
 HUM:      31.5  mm     G. Age:  20w 4d         86  %
 CER:      19.5  mm     G. Age:  18w 5d         40  %
 NFT:       3.9  mm
 LV:        6.6  mm
 CM:        4.7  mm

 Est. FW:     344   gm   0 lb 12 oz      96  %
OB History

 Gravidity:    1
Gestational Age

 Clinical EDD:  19w 1d                                        EDD:   04/24/20
 U/S Today:     20w 2d                                        EDD:   04/16/20
 Best:          19w 1d     Det. By:  Clinical EDD             EDD:   04/24/20
Anatomy

 Cranium:               Appears normal         LVOT:                   Appears normal
 Cavum:                 Appears normal         Aortic Arch:            Appears normal
 Ventricles:            Appears normal         Ductal Arch:            Appears normal
 Choroid Plexus:        Appears normal         Diaphragm:              Appears normal
 Cerebellum:            Appears normal         Stomach:                Appears normal, left
                                                                       sided
 Posterior Fossa:       Appears normal         Abdomen:                Appears normal
 Nuchal Fold:           Appears normal         Abdominal Wall:         Appears nml (cord
                                                                       insert, abd wall)
 Face:                  Appears normal         Cord Vessels:           Appears normal (3
                        (orbits and profile)                           vessel cord)
 Lips:                  Appears normal         Kidneys:                Appear normal
 Palate:                Appears normal         Bladder:                Appears normal
 Thoracic:              Appears normal         Spine:                  Appears normal
 Heart:                 Appears normal         Upper Extremities:      Appears normal
                        (4CH, axis, and
                        situs)
 RVOT:                  Appears normal         Lower Extremities:      Appears normal

 Other:  Nasal bone visualized. Female gender Heels and 5th digit visualized.
Cervix Uterus Adnexa

 Cervix
 Length:           4.39  cm.
 Normal appearance by transabdominal scan.

 Uterus
 No abnormality visualized.

 Left Ovary
 Within normal limits.

 Right Ovary
 Within normal limits.

 Cul De Sac
 No free fluid seen.
Impression

 Normal anatomy
 Good fetal movement and amnoitic fluid.
Recommendations

 Follow up as clincially indicated.
